# Patient Record
Sex: Female | Born: 1965 | Race: White | Hispanic: No | Marital: Married | Smoking: Never smoker
Health system: Southern US, Community
[De-identification: ages and names within clinical notes are randomized; demographics above are authoritative.]

## PROBLEM LIST (undated history)

## (undated) DIAGNOSIS — F99 Mental disorder, not otherwise specified: Secondary | ICD-10-CM

## (undated) DIAGNOSIS — X838XXA Intentional self-harm by other specified means, initial encounter: Secondary | ICD-10-CM

## (undated) DIAGNOSIS — R112 Nausea with vomiting, unspecified: Secondary | ICD-10-CM

## (undated) DIAGNOSIS — E079 Disorder of thyroid, unspecified: Secondary | ICD-10-CM

## (undated) DIAGNOSIS — I10 Essential (primary) hypertension: Secondary | ICD-10-CM

## (undated) DIAGNOSIS — R51 Headache: Secondary | ICD-10-CM

## (undated) DIAGNOSIS — K219 Gastro-esophageal reflux disease without esophagitis: Secondary | ICD-10-CM

## (undated) DIAGNOSIS — F419 Anxiety disorder, unspecified: Secondary | ICD-10-CM

## (undated) DIAGNOSIS — F329 Major depressive disorder, single episode, unspecified: Secondary | ICD-10-CM

## (undated) DIAGNOSIS — J302 Other seasonal allergic rhinitis: Secondary | ICD-10-CM

## (undated) DIAGNOSIS — Z9889 Other specified postprocedural states: Secondary | ICD-10-CM

## (undated) DIAGNOSIS — F32A Depression, unspecified: Secondary | ICD-10-CM

## (undated) HISTORY — PX: MOUTH SURGERY: SHX715

---

## 1997-08-10 ENCOUNTER — Inpatient Hospital Stay (HOSPITAL_COMMUNITY): Admission: AD | Admit: 1997-08-10 | Discharge: 1997-08-13 | Payer: Self-pay | Admitting: Obstetrics and Gynecology

## 1997-08-13 ENCOUNTER — Encounter: Admission: RE | Admit: 1997-08-13 | Discharge: 1997-11-11 | Payer: Self-pay | Admitting: Obstetrics and Gynecology

## 2001-05-23 ENCOUNTER — Inpatient Hospital Stay (HOSPITAL_COMMUNITY): Admission: AD | Admit: 2001-05-23 | Discharge: 2001-05-23 | Payer: Self-pay | Admitting: Obstetrics and Gynecology

## 2002-01-25 ENCOUNTER — Other Ambulatory Visit: Admission: RE | Admit: 2002-01-25 | Discharge: 2002-01-25 | Payer: Self-pay | Admitting: Obstetrics and Gynecology

## 2002-07-25 ENCOUNTER — Inpatient Hospital Stay (HOSPITAL_COMMUNITY): Admission: AD | Admit: 2002-07-25 | Discharge: 2002-07-25 | Payer: Self-pay | Admitting: Obstetrics and Gynecology

## 2002-07-25 ENCOUNTER — Encounter: Payer: Self-pay | Admitting: Obstetrics and Gynecology

## 2002-08-09 ENCOUNTER — Inpatient Hospital Stay (HOSPITAL_COMMUNITY): Admission: AD | Admit: 2002-08-09 | Discharge: 2002-08-12 | Payer: Self-pay | Admitting: Obstetrics and Gynecology

## 2002-09-21 ENCOUNTER — Other Ambulatory Visit: Admission: RE | Admit: 2002-09-21 | Discharge: 2002-09-21 | Payer: Self-pay | Admitting: Obstetrics and Gynecology

## 2003-03-18 ENCOUNTER — Emergency Department (HOSPITAL_COMMUNITY): Admission: AD | Admit: 2003-03-18 | Discharge: 2003-03-18 | Payer: Self-pay | Admitting: Family Medicine

## 2003-05-06 ENCOUNTER — Ambulatory Visit (HOSPITAL_COMMUNITY): Admission: RE | Admit: 2003-05-06 | Discharge: 2003-05-06 | Payer: Self-pay | Admitting: Endocrinology

## 2003-07-29 ENCOUNTER — Inpatient Hospital Stay (HOSPITAL_COMMUNITY): Admission: EM | Admit: 2003-07-29 | Discharge: 2003-07-31 | Payer: Self-pay | Admitting: Emergency Medicine

## 2003-08-02 ENCOUNTER — Other Ambulatory Visit (HOSPITAL_COMMUNITY): Admission: RE | Admit: 2003-08-02 | Discharge: 2003-08-06 | Payer: Self-pay | Admitting: Psychiatry

## 2003-10-23 ENCOUNTER — Other Ambulatory Visit: Admission: RE | Admit: 2003-10-23 | Discharge: 2003-10-23 | Payer: Self-pay | Admitting: Obstetrics and Gynecology

## 2004-12-01 ENCOUNTER — Other Ambulatory Visit: Admission: RE | Admit: 2004-12-01 | Discharge: 2004-12-01 | Payer: Self-pay | Admitting: Obstetrics and Gynecology

## 2005-10-15 ENCOUNTER — Encounter: Admission: RE | Admit: 2005-10-15 | Discharge: 2005-10-15 | Payer: Self-pay | Admitting: Neurology

## 2006-04-02 ENCOUNTER — Encounter: Admission: RE | Admit: 2006-04-02 | Discharge: 2006-04-02 | Payer: Self-pay | Admitting: Endocrinology

## 2006-04-15 ENCOUNTER — Encounter (INDEPENDENT_AMBULATORY_CARE_PROVIDER_SITE_OTHER): Payer: Self-pay | Admitting: *Deleted

## 2006-04-15 ENCOUNTER — Other Ambulatory Visit: Admission: RE | Admit: 2006-04-15 | Discharge: 2006-04-15 | Payer: Self-pay | Admitting: Interventional Radiology

## 2006-04-15 ENCOUNTER — Encounter: Admission: RE | Admit: 2006-04-15 | Discharge: 2006-04-15 | Payer: Self-pay | Admitting: Endocrinology

## 2006-10-01 ENCOUNTER — Encounter: Admission: RE | Admit: 2006-10-01 | Discharge: 2006-10-01 | Payer: Self-pay | Admitting: Allergy and Immunology

## 2006-11-03 ENCOUNTER — Encounter: Admission: RE | Admit: 2006-11-03 | Discharge: 2006-11-03 | Payer: Self-pay | Admitting: Endocrinology

## 2007-08-18 ENCOUNTER — Emergency Department (HOSPITAL_COMMUNITY): Admission: EM | Admit: 2007-08-18 | Discharge: 2007-08-18 | Payer: Self-pay | Admitting: Family Medicine

## 2007-08-18 ENCOUNTER — Emergency Department (HOSPITAL_COMMUNITY): Admission: EM | Admit: 2007-08-18 | Discharge: 2007-08-18 | Payer: Self-pay | Admitting: Emergency Medicine

## 2008-07-16 ENCOUNTER — Encounter: Admission: RE | Admit: 2008-07-16 | Discharge: 2008-07-16 | Payer: Self-pay | Admitting: Endocrinology

## 2009-01-03 ENCOUNTER — Encounter: Admission: RE | Admit: 2009-01-03 | Discharge: 2009-01-03 | Payer: Self-pay | Admitting: Endocrinology

## 2010-01-16 ENCOUNTER — Emergency Department (HOSPITAL_COMMUNITY): Admission: EM | Admit: 2010-01-16 | Discharge: 2010-01-16 | Payer: Self-pay | Admitting: Emergency Medicine

## 2010-03-12 ENCOUNTER — Encounter: Admission: RE | Admit: 2010-03-12 | Discharge: 2010-03-12 | Payer: Self-pay | Admitting: Endocrinology

## 2010-05-18 ENCOUNTER — Encounter: Payer: Self-pay | Admitting: Endocrinology

## 2010-05-19 ENCOUNTER — Encounter: Payer: Self-pay | Admitting: Endocrinology

## 2010-06-04 ENCOUNTER — Ambulatory Visit
Admission: RE | Admit: 2010-06-04 | Discharge: 2010-06-04 | Disposition: A | Discharge status: Home / Self Care | Payer: BC Managed Care – PPO | Source: Ambulatory Visit | Attending: Allergy and Immunology | Admitting: Allergy and Immunology

## 2010-06-04 ENCOUNTER — Other Ambulatory Visit: Payer: Self-pay | Admitting: Allergy and Immunology

## 2010-09-12 NOTE — H&P (Signed)
   Cynthia Hogan, Cynthia Hogan                       ACCOUNT NO.:  [id number]   MEDICAL RECORD NO.:  [id number]                   PATIENT TYPE:  INP   LOCATION:  NA                                   FACILITY:  [facility]   PHYSICIAN:  Dineen Kid. Rana Snare, M.D.                 DATE OF BIRTH:  Jan 22, 1966   DATE OF ADMISSION:  08/09/2002  DATE OF DISCHARGE:                                HISTORY & PHYSICAL   HISTORY OF PRESENT ILLNESS:  The patient is a 45 year old, G3, P1, at [redacted]  weeks gestational age who presents for cesarean section.  The patient has  history of difficult delivery of 8 pound 3 ounce infant in 1999, also  history of shoulder dystocia.  The patient at this time has a large-for-  gestational-age infant which at 37 weeks is measured at 8.2 pounds which is  96th percentile.  Because of history of difficult delivery, previous  shoulder dystocia, and also large for gestational age, she presents for  primary cesarean section.  Her pregnancy is complicated by advanced maternal  age.  She declined amniocentesis but had normal ultrasound including level 2  ultrasound.  The patient also has chronic hypertension and has been on  Aldomet and has had normal blood pressure on this medication throughout the  pregnancy.  She also has a history of group B strep, and her Glucola was  within normal limits.   PAST MEDICAL HISTORY:  Significant only for acid reflux and postpartum  depression after the last delivery and history of migraines.   PAST SURGICAL HISTORY:  Negative.   PAST OBSTETRICAL HISTORY:  She has had one vaginal delivery with shoulder  dystocia of 8 pound 3 ounce baby in 1999, and she had a miscarriage in  January 2003.   PHYSICAL EXAMINATION:  VITAL SIGNS:  Blood pressure 124/78.  HEART:  Regular rate and rhythm.  LUNGS:  Clear to auscultation bilaterally.  ABDOMEN:  Gravid, nontender.  PELVIC:  Cervix is closed, thick, and high.   IMPRESSION:  1. Intrauterine pregnancy at 39  weeks.  2. Previous difficult delivery with shoulder dystocia.  3. Large-for-gestational-age infant this pregnancy.   PLAN:  Primary low transverse cesarean section.  Risks and benefits were  discussed at length.  Informed consent was obtained.                                               Dineen Kid Rana Snare, M.D.    DCL/MEDQ  D:  08/08/2002  T:  08/08/2002  Job:  [id number]

## 2010-09-12 NOTE — Discharge Summary (Signed)
NAMEELLOWYN, RIEVES ANN                       ACCOUNT NO.:  [id number]   MEDICAL RECORD NO.:  [id number]                   PATIENT TYPE:  INP   LOCATION:  9105                                 [facility]ACILITY:  [facility]   PHYSICIAN:  Juluis Mire, M.D.                DATE OF BIRTH:  [birth year]   DATE OF ADMISSION:  08/09/2002  DATE OF DISCHARGE:  08/12/2002                                 DISCHARGE SUMMARY   ADMITTING DIAGNOSES:  1. Intrauterine pregnancy at 56 weeks estimated gestational age.  2. Previous shoulder dystocia and difficult delivery.  3. Large for gestational age infant.   DISCHARGE DIAGNOSES:  1. Status post low transverse cesarean section.  2. Viable female infant.   PROCEDURE:  Primary low transverse cesarean section.   REASON FOR ADMISSION:  Please see dictated H&P.   HOSPITAL COURSE:  The patient is a 45 year old gravida 3, para 1, that was  admitted to [facility] at 39 weeks estimated gestational  age.  Her previous delivery had been complicated by shoulder dystocia and a  difficult delivery.  Ultrasound with this pregnancy indicated a baby  weighing approximately 8 pounds and 2 ounces which was in the 96th  percentile.  Because of this, she presented to [facility]  for primary low transverse cesarean section.  On the morning of her  admission, the patient was taken to the operating room where spinal  anesthesia was administered without difficulty.  A low transverse incision  was made with the delivery of a viable female infant weighing 8 pounds and 15  ounces, Apgars of 7 at one minute and 9 at five minutes.  Umbilical cord pH  was 7.28.  The patient tolerated the procedure well and was taken to the  recovery room in stable condition.  On postoperative day 1, the patient had  good return of bowel function.  Abdomen was soft.  The fundus was firm and  nontender.  Abdominal dressing was clean, dry, and intact.  Labs show a  hemoglobin of  10.7, platelet count of 211,000, WBC count of 15.6.  On  postoperative day 2, the patient was doing well, vital signs were stable,  and she was afebrile.  Abdomen was soft and nontender.  Incision was noted  to be clean, dry, and intact.  The patient was ambulating well, tolerating a  regular diet, with no signs of nausea or vomiting.  On postoperative day,  the patient was doing well.  Vital signs were stable.  Abdomen was soft.  Fundus was firm.  Incision was clean, dry, and intact.  The staples were  removed and the patient was discharged home.   CONDITION ON DISCHARGE:  Good.   DIET:  Regular, as tolerated.   ACTIVITY:  No heavy lifting.  No driving x2 weeks.  No vaginal entry.   FOLLOWUP:  The patient is to follow up in the office in  one week for an  incision check.  She is to call for temperature greater than 103, persistent  nausea and vomiting, heavy vaginal bleeding, and redness or drainage of  incisional site.   DISCHARGE MEDICATIONS:  1. Tylox, #30, one p.o. every 4-6 hours p.r.n. pain.  2. Motrin 600 mg one every 6 hours p.r.n.  3. Prenatal vitamins one p.o. daily.  4. Colace one p.o. daily p.r.n.     Julio Sicks, N.P.                        Juluis Mire, M.D.    CC/MEDQ  D:  09/21/2002  T:  09/21/2002  Job:  [id number]

## 2010-09-12 NOTE — Op Note (Signed)
Cynthia Hogan, DIGUGLIELMO ANN                       ACCOUNT NO.:  [id number]   MEDICAL RECORD NO.:  [id number]                   PATIENT TYPE:  INP   LOCATION:  9198                                 [facility]ACILITY:  [facility]   PHYSICIAN:  Dineen Kid. Rana Snare, M.D.                 DATE OF BIRTH:  08-09-1965   DATE OF PROCEDURE:  08/09/2002  DATE OF DISCHARGE:                                 OPERATIVE REPORT   PREOPERATIVE DIAGNOSES:  1. Intrauterine pregnancy at 39 weeks.  2. Previous shoulder dystocia and difficult delivery.  3. Large for gestational age infant.   POSTOPERATIVE DIAGNOSES:  1. Intrauterine pregnancy at 39 weeks.  2. Previous shoulder dystocia and difficult de.  3. Large for gestational age infant.   PROCEDURE:  Primary low segment transverse cesarean section.   SURGEON:  Dineen Kid. Rana Snare, M.D.   ASSISTANTFreddy Finner, M.D.   ANESTHESIA:  Spinal.   ESTIMATED BLOOD LOSS:  800 mL.   INDICATIONS:  The patient is a 45 year old G3, P1 at 39 weeks.  Previous  delivery was complicated by shoulder dystocia and difficult delivery.  This  child is large for gestational age with last ultrasound showing 8.2 pound  baby with 96th percentile.  Because of the above she presents for primary  low segment transverse cesarean section.  Risks and benefits were discussed.  Informed consent was obtained.   FINDINGS:  Viable female infant.  Apgars were 7 and 9.  pH arterial was 7.28.  Weight is 8 pounds 15 ounces.   DESCRIPTION OF PROCEDURE:  After adequate analgesia the patient is placed in  the supine position, left lateral tilt.  She is sterilely prepped and  draped.  The bladder was sterilely drained.  The Pfannenstiel skin incision  was made two fingerbreadths above the pubic symphysis, taken down to the  fascia which was incised transversely, extended superiorly and inferiorly  off the bellies of the rectus muscle which were separated sharply in the  midline.  The peritoneum was entered  sharply.  Bladder blade was placed and  the uterine serosa was elevated, nicked, incised transversely and the  bladder flap was created.  A low segment myotomy incision was made down to  the amniotic sac.  Clear fluid was noted.  The incision was extended  laterally with the operator's fingertips.  The infant's vertex was delivered  into the incision.  Vacuum extractor was placed on the fetal vertex and was  delivered through the incision atraumatically.  Nares and pharynx were  suctioned.  Infant was then delivered.  Cord clamped, cut.  Infant was  handed to the pediatricians for resuscitation.  Cord blood was obtained.  The placenta extracted manually.  The uterus was exteriorized __________  with a dry lap.  The myotomy incision was closed in two layers, the first  being a running locking layer, the second being an imbricating layer of 0  Monocryl suture.  Irrigation was applied and hemostasis was achieved with  figure-of-eight of 0 Monocryl in the right incisional corner.  After  hemostasis was achieved the uterus was placed back in the peritoneal cavity.  After a copious amount of irrigation adequate hemostasis was assured.  The  peritoneum was closed with 0 Monocryl in a running fashion.  Rectus muscle  plicated in the midline.  Irrigation was applied and fascia was closed in  single layer with 0 PDS in a running fashion.  Irrigation once again applied  and after adequate hemostasis the skin was stapled, Steri-Strips applied.  The patient tolerated procedure well.  Was stable on transfer to recovery  room.  Sponge and instrument count was normal x3.  Estimated blood loss from  the procedure was 800 mL.                                               Dineen Kid Rana Snare, M.D.    DCL/MEDQ  D:  08/09/2002  T:  08/09/2002  Job:  [id number]
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
## 2011-03-16 ENCOUNTER — Other Ambulatory Visit: Payer: Self-pay | Admitting: Endocrinology

## 2011-03-16 DIAGNOSIS — E049 Nontoxic goiter, unspecified: Secondary | ICD-10-CM

## 2011-03-24 ENCOUNTER — Other Ambulatory Visit: Payer: BC Managed Care – PPO

## 2011-03-25 ENCOUNTER — Ambulatory Visit
Admission: RE | Admit: 2011-03-25 | Discharge: 2011-03-25 | Disposition: A | Discharge status: Home / Self Care | Payer: BC Managed Care – PPO | Source: Ambulatory Visit | Attending: Endocrinology | Admitting: Endocrinology

## 2011-03-25 DIAGNOSIS — E049 Nontoxic goiter, unspecified: Secondary | ICD-10-CM

## 2011-04-16 NOTE — H&P (Addendum)
  Cynthia Hogan is an 45 y.o. female.   Chief Complaint: Thyroid mass HPI: Right thyroid mass, enlarging over the years based on serial ultrasound exams.  No past medical history on file.  No past surgical history on file.  No family history on file. Social History:  does not have a smoking history on file. She does not have any smokeless tobacco history on file. Her alcohol and drug histories not on file.  Allergies: Allergies not on file  No current facility-administered medications on file as of .   No current outpatient prescriptions on file as of .    No results found for this or any previous visit (from the past 48 hour(s)). No results found.  ROS: otherwise negative  There were no vitals taken for this visit.  PHYSICAL EXAM: Overall appearance:  Healthy appearing, in no distress Head:  Normocephalic, atraumatic. Ears: External auditory canals are clear; tympanic membranes are intact and the middle ears are free of any effusion. Nose: External nose is healthy in appearance. Internal nasal exam free of any lesions or obstruction. Oral Cavity:  There are no mucosal lesions or masses identified. Oral Pharynx/Hypopharynx/Larynx: no signs of any mucosal lesions or masses identified. Vocal cords move normally. Neuro:  No identifiable neurologic deficits. Neck: Right thyroid lobe diffusely enlarged. No other masses.  Studies Reviewed: ultrasound reviewed.    Assessment/Plan Right thyroidectomt, with frozen section and possible total thyroidectomy.  Elijah Michaelis H 04/16/2011, 11:12 AM   The patient has been re-examined.  The patient's history and physical has been reviewed and is unchanged.  There is no change in the plan of care.   Serena Colonel, MD

## 2011-04-24 ENCOUNTER — Encounter (HOSPITAL_COMMUNITY): Payer: Self-pay

## 2011-05-04 ENCOUNTER — Other Ambulatory Visit: Payer: Self-pay

## 2011-05-04 ENCOUNTER — Encounter (HOSPITAL_COMMUNITY)
Admission: RE | Admit: 2011-05-04 | Discharge: 2011-05-04 | Disposition: A | Discharge status: Home / Self Care | Payer: BC Managed Care – PPO | Source: Ambulatory Visit | Attending: Anesthesiology | Admitting: Anesthesiology

## 2011-05-04 ENCOUNTER — Encounter (HOSPITAL_COMMUNITY)
Admission: RE | Admit: 2011-05-04 | Discharge: 2011-05-04 | Disposition: A | Discharge status: Home / Self Care | Payer: BC Managed Care – PPO | Source: Ambulatory Visit | Attending: Otolaryngology | Admitting: Otolaryngology

## 2011-05-04 ENCOUNTER — Encounter (HOSPITAL_COMMUNITY): Payer: Self-pay

## 2011-05-04 HISTORY — DX: Anxiety disorder, unspecified: F41.9

## 2011-05-04 HISTORY — DX: Disorder of thyroid, unspecified: E07.9

## 2011-05-04 HISTORY — DX: Gastro-esophageal reflux disease without esophagitis: K21.9

## 2011-05-04 HISTORY — DX: Major depressive disorder, single episode, unspecified: F32.9

## 2011-05-04 HISTORY — DX: Essential (primary) hypertension: I10

## 2011-05-04 HISTORY — DX: Mental disorder, not otherwise specified: F99

## 2011-05-04 HISTORY — DX: Depression, unspecified: F32.A

## 2011-05-04 HISTORY — DX: Other specified postprocedural states: Z98.890

## 2011-05-04 HISTORY — DX: Other specified postprocedural states: R11.2

## 2011-05-04 HISTORY — DX: Intentional self-harm by other specified means, initial encounter: X83.8XXA

## 2011-05-04 HISTORY — DX: Headache: R51

## 2011-05-04 HISTORY — DX: Other seasonal allergic rhinitis: J30.2

## 2011-05-04 LAB — BASIC METABOLIC PANEL
BUN: 13 mg/dL (ref 6–23)
CO2: 28 mEq/L (ref 19–32)
Calcium: 10.2 mg/dL (ref 8.4–10.5)
Chloride: 103 mEq/L (ref 96–112)
Creatinine, Ser: 0.72 mg/dL (ref 0.50–1.10)
GFR calc Af Amer: >90 mL/min (ref >90)
GFR calc non Af Amer: >90 mL/min (ref >90)
Glucose, Bld: 82 mg/dL (ref 70–99)
Potassium: 4.2 mEq/L (ref 3.5–5.1)
Sodium: 140 mEq/L (ref 135–145)

## 2011-05-04 LAB — SURGICAL PCR SCREEN
MRSA, PCR: NEGATIVE
Staphylococcus aureus: NEGATIVE

## 2011-05-04 LAB — CBC
HCT: 40.7 % (ref 36.0–46.0)
Hemoglobin: 13.2 g/dL (ref 12.0–15.0)
MCH: 28.8 pg (ref 26.0–34.0)
MCHC: 32.4 g/dL (ref 30.0–36.0)
MCV: 88.7 fL (ref 78.0–100.0)
Platelets: 332 10*3/uL (ref 150–400)
RBC: 4.59 MIL/uL (ref 3.87–5.11)
RDW: 13.5 % (ref 11.5–15.5)
WBC: 12.3 10*3/uL — ABNORMAL HIGH (ref 4.0–10.5)

## 2011-05-04 LAB — HCG, SERUM, QUALITATIVE: Preg, Serum: NEGATIVE

## 2011-05-04 NOTE — Progress Notes (Signed)
Chart to Shonna Chock PA to review for EKG.

## 2011-05-04 NOTE — Consult Note (Addendum)
Anesthesia:  Patient is a 46 year old female scheduled for a right thyroid lobectomy, possible thyroidectomy on 05/07/11.  History includes anxiety, depression, HTN, GERD, obesity, headaches, and attempted suicide.  Pre-operative labs acceptable.  CXR shows no active disease.  EKG shows NSR, new right BBB and LAD, cannot rule out anterior infarct (age undetermined).    I reviewed the EKG with Dr. Noreene Larsson.  Cardiology pre-operative evaluation recommended.  Dr. Lucky Rathke office is now closed, so I will notify his office tomorrow.  Addendum:  05/05/11 1610  Amy at Dr. Lucky Rathke office notified of need for preoperative Cardiology evaluation due to abnormal EKG.   Addendum: 05/06/11 1500  Dr. Patty Sermons saw Ms. Cynthia Hogan today and cleared her for this procedure without further testing.  See his office note under the Notes tab.

## 2011-05-04 NOTE — Pre-Procedure Instructions (Signed)
20 Kieanna Rollo  05/04/2011   Your procedure is scheduled on:  05/07/11  Report to [facility] at 5:30 AM.  Call this number if you have problems the morning of surgery: [phone]   Remember:   Do not eat food:After Midnight.  May have clear liquids: up to 4 Hours before arrival.  Clear liquids include soda, tea, black coffee, apple or grape juice, broth.  Take these medicines the morning of surgery with A SIP OF WATER: Zyrtec, Prozac, Lamictal, Synthroid, Vyvanse, Lithium, Zegerid, Zantac   Do not wear jewelry, make-up or nail polish.  Do not wear lotions, powders, or perfumes. You may wear deodorant.  Do not shave 48 hours prior to surgery.  Do not bring valuables to the hospital.  Contacts, dentures or bridgework may not be worn into surgery.  Leave suitcase in the car. After surgery it may be brought to your room.  For patients admitted to the hospital, checkout time is 11:00 AM the day of discharge.   Patients discharged the day of surgery will not be allowed to drive home.  Name and phone number of your driver: Patient is being admitted.  Special Instructions: CHG Shower Use Special Wash: 1/2 bottle night before surgery and 1/2 bottle morning of surgery.   Please read over the following fact sheets that you were given: Pain Booklet, Coughing and Deep Breathing, MRSA Information and Surgical Site Infection Prevention

## 2011-05-06 ENCOUNTER — Other Ambulatory Visit: Payer: Self-pay | Admitting: *Deleted

## 2011-05-06 ENCOUNTER — Ambulatory Visit (INDEPENDENT_AMBULATORY_CARE_PROVIDER_SITE_OTHER): Payer: BC Managed Care – PPO | Admitting: Cardiology

## 2011-05-06 VITALS — BP 108/74 | HR 80 | Ht 66.0 in | Wt 202.0 lb

## 2011-05-06 DIAGNOSIS — I4510 Unspecified right bundle-branch block: Secondary | ICD-10-CM

## 2011-05-06 MED ORDER — CEFAZOLIN SODIUM-DEXTROSE 2-3 GM-% IV SOLR
2.0000 g | INTRAVENOUS | Status: AC
  Administered 2011-05-07: 2 g via INTRAVENOUS
  Filled 2011-05-06: qty 50

## 2011-05-06 NOTE — Progress Notes (Signed)
Reason for Consult: Preoperative clearance Referring Physician: Dr. Langston Masker Cynthia Hogan is an 46 y.o. female.  HPI: This pleasant 46 year old woman is seen at the request of Dr. Noreene Larsson, anesthesiology, for evaluation of an abnormal EKG.  On her preoperative electrocardiogram done on 05/04/11 she was found to have a pattern of right bundle branch block.  She had had a previous EKG in 2005 which did not show a right bundle branch block.  She does not think that she has had any intervening EKGs.  She is scheduled for a thyroidectomy tomorrow.  The patient does not have any history of known heart disease.  She denies any chest pain or shortness of breath.  She denies any palpitations.  She has not had any history of dizzy spells or syncope.  Her energy level is good.  Her chest x-ray done 2 days ago shows normal heart size and clear lungs.  She has an annual physical with Dr. Clelia Croft each year and she is not diabetic and her cholesterol levels have been satisfactory.  She does not have any history of premature coronary disease in both of her parents are living and are in reasonably good health.  Past Medical History  Diagnosis Date  . PONV (postoperative nausea and vomiting)   . Seasonal allergies   . Thyroid mass   . Mental disorder     Bipolar  . Anxiety   . Depression   . Hypertension   . GERD (gastroesophageal reflux disease)   . Headache     has daily, migranes in past  . Suicide     attempt about 7 yrs ago    Past Surgical History  Procedure Date  . Cesarean section   . Mouth surgery     No family history on file.  Social History:  reports that she has never smoked. She has never used smokeless tobacco. She reports that she drinks alcohol. Her drug history not on file.  Allergies:  Allergies  Allergen Reactions  . Other     Anesthesia med makes pt very ill. Pt not sure which medication it was.    Medications: I have reviewed the patient's current medications.  No  results found for this or any previous visit (from the past 48 hour(s)).  No results found.  Review of systems: Negative except for present illness.  She has had a known goiter for several years and it has grown in size.  She also has a family history of thyroid cancer. Blood pressure 108/74, pulse 80, height 5\' 6"  (1.676 m), weight 202 lb (91.627 kg). The general appearance reveals a well-developed well-nourished woman in no distress.The head and neck exam reveals pupils equal and reactive.  Extraocular movements are full.  There is no scleral icterus.  The mouth and pharynx are normal.  The neck is supple.  The carotids reveal no bruits.  The jugular venous pressure is normal.  The  thyroid is enlarged on the right side and a pattern of a goiter.  There is no lymphadenopathy.  The chest is clear to percussion and auscultation.  There are no rales or rhonchi.  Expansion of the chest is symmetrical.  The precordium is quiet.  The first heart sound is normal.  The second heart sound is physiologically split.  There is no murmur gallop rub or click.  There is no abnormal lift or heave.  The abdomen is soft and nontender.  The bowel sounds are normal.  The liver and spleen are  not enlarged.  There are no abdominal masses.  There are no abdominal bruits.  Extremities reveal good pedal pulses.  There is no phlebitis or edema.  There is no cyanosis or clubbing.  Strength is normal and symmetrical in all extremities.  There is no lateralizing weakness.  There are no sensory deficits.  The skin is warm and dry.  There is no rash.  EKG done today shows normal sinus rhythm at 66 per minute and a right bundle branch block pattern.  There are no ischemic changes.  Assessment/Plan: 1.  Asymptomatic right bundle branch block of unknown duration, not present in 2005 2.   Bipolar illness 3.   Goiter 4.   GERD  Recommendation: The patient is  cleared for thyroid  surgery tomorrow. No further cardiac workup is  indicated at this time.  I did tell her that she should ask for a EKG each time she has her annual medical physical with Dr. Clelia Croft to confirm as to whether the right bundle branch block is fixed or intermittent.  If the patient begins to have any cardiac symptoms such as palpitations or chest pain or shortness of breath, further cardiology workup could be considered such as an echocardiogram or possibly a stress test.  No further studies are needed at this time however. Thank you for the opportunity to see this pleasant woman with you.  Cassell Clement 05/06/2011, 12:59 PM

## 2011-05-06 NOTE — Progress Notes (Signed)
Addended by: Regis Bill B on: 05/06/2011 03:13 PM   Modules accepted: Orders

## 2011-05-06 NOTE — Patient Instructions (Signed)
Your physician recommends that you continue on your current medications as directed. Please refer to the Current Medication list given to you today. Return on an as needed basis

## 2011-05-06 NOTE — Progress Notes (Signed)
Pt. Has been cleared by Dr. Patty Sermons for surgery per Jasmine December in Dr. Lucky Rathke office. Pt. Will bring clearance letter with her in the AM.

## 2011-05-06 NOTE — Progress Notes (Signed)
Dr. Lucky Rathke nurse states that pt. Is scheduled to see Dr. Patty Sermons today @ 11:15 am for cardiac clearance.  The office will fax [place] any information from Dr. Patty Sermons to [phone].

## 2011-05-07 ENCOUNTER — Encounter (HOSPITAL_COMMUNITY): Payer: Self-pay | Admitting: Vascular Surgery

## 2011-05-07 ENCOUNTER — Ambulatory Visit (HOSPITAL_COMMUNITY)
Admission: RE | Admit: 2011-05-07 | Discharge: 2011-05-08 | Disposition: A | Discharge status: Home / Self Care | Payer: BC Managed Care – PPO | Source: Ambulatory Visit | Attending: Otolaryngology | Admitting: Otolaryngology

## 2011-05-07 ENCOUNTER — Encounter (HOSPITAL_COMMUNITY)
Admission: RE | Disposition: A | Discharge status: Home / Self Care | Payer: Self-pay | Source: Ambulatory Visit | Attending: Otolaryngology

## 2011-05-07 ENCOUNTER — Ambulatory Visit (HOSPITAL_COMMUNITY): Payer: BC Managed Care – PPO | Admitting: Vascular Surgery

## 2011-05-07 ENCOUNTER — Encounter (HOSPITAL_COMMUNITY): Payer: Self-pay | Admitting: *Deleted

## 2011-05-07 ENCOUNTER — Other Ambulatory Visit: Payer: Self-pay | Admitting: Otolaryngology

## 2011-05-07 DIAGNOSIS — Z01812 Encounter for preprocedural laboratory examination: Secondary | ICD-10-CM | POA: Insufficient documentation

## 2011-05-07 DIAGNOSIS — I10 Essential (primary) hypertension: Secondary | ICD-10-CM | POA: Insufficient documentation

## 2011-05-07 DIAGNOSIS — Z01810 Encounter for preprocedural cardiovascular examination: Secondary | ICD-10-CM | POA: Insufficient documentation

## 2011-05-07 DIAGNOSIS — Z01818 Encounter for other preprocedural examination: Secondary | ICD-10-CM | POA: Insufficient documentation

## 2011-05-07 DIAGNOSIS — K219 Gastro-esophageal reflux disease without esophagitis: Secondary | ICD-10-CM | POA: Insufficient documentation

## 2011-05-07 DIAGNOSIS — R51 Headache: Secondary | ICD-10-CM | POA: Insufficient documentation

## 2011-05-07 DIAGNOSIS — E049 Nontoxic goiter, unspecified: Secondary | ICD-10-CM | POA: Insufficient documentation

## 2011-05-07 DIAGNOSIS — E079 Disorder of thyroid, unspecified: Secondary | ICD-10-CM

## 2011-05-07 HISTORY — PX: THYROIDECTOMY: SHX17

## 2011-05-07 SURGERY — THYROIDECTOMY
Anesthesia: General | Site: Neck | Laterality: Right | Wound class: Clean

## 2011-05-07 MED ORDER — DEXTROSE-NACL 5-0.9 % IV SOLN
INTRAVENOUS | Status: DC
  Administered 2011-05-07 – 2011-05-08 (×2): via INTRAVENOUS

## 2011-05-07 MED ORDER — ANIMAL SHAPES WITH C & FA PO CHEW
2.0000 | CHEWABLE_TABLET | Freq: Every day | ORAL | Status: DC
  Filled 2011-05-07 (×2): qty 2

## 2011-05-07 MED ORDER — 0.9 % SODIUM CHLORIDE (POUR BTL) OPTIME
TOPICAL | Status: DC | PRN
  Administered 2011-05-07: 1000 mL

## 2011-05-07 MED ORDER — PROPOFOL 10 MG/ML IV EMUL
INTRAVENOUS | Status: DC | PRN
  Administered 2011-05-07: 200 mg via INTRAVENOUS

## 2011-05-07 MED ORDER — ROCURONIUM BROMIDE 100 MG/10ML IV SOLN
INTRAVENOUS | Status: DC | PRN
  Administered 2011-05-07: 40 mg via INTRAVENOUS

## 2011-05-07 MED ORDER — FAMOTIDINE 10 MG PO TABS
10.0000 mg | ORAL_TABLET | Freq: Two times a day (BID) | ORAL | Status: DC
  Administered 2011-05-08: 10 mg via ORAL
  Filled 2011-05-07 (×3): qty 1

## 2011-05-07 MED ORDER — FLUOXETINE HCL 20 MG PO CAPS
20.0000 mg | ORAL_CAPSULE | Freq: Every day | ORAL | Status: DC
  Administered 2011-05-07 – 2011-05-08 (×2): 20 mg via ORAL
  Filled 2011-05-07 (×3): qty 1

## 2011-05-07 MED ORDER — PHENOL 1.4 % MT LIQD
1.0000 | OROMUCOSAL | Status: DC | PRN
  Administered 2011-05-07: 1 via OROMUCOSAL
  Filled 2011-05-07: qty 177

## 2011-05-07 MED ORDER — LIDOCAINE-EPINEPHRINE 1 %-1:100000 IJ SOLN
INTRAMUSCULAR | Status: DC | PRN
  Administered 2011-05-07: 2.5 mL via INTRADERMAL

## 2011-05-07 MED ORDER — LACTATED RINGERS IV SOLN
INTRAVENOUS | Status: DC | PRN
  Administered 2011-05-07 (×2): via INTRAVENOUS

## 2011-05-07 MED ORDER — ONDANSETRON HCL 4 MG/2ML IJ SOLN
INTRAMUSCULAR | Status: DC | PRN
  Administered 2011-05-07: 4 mg via INTRAVENOUS

## 2011-05-07 MED ORDER — IBUPROFEN 100 MG/5ML PO SUSP
400.0000 mg | Freq: Four times a day (QID) | ORAL | Status: DC | PRN
  Filled 2011-05-07: qty 20

## 2011-05-07 MED ORDER — LISDEXAMFETAMINE DIMESYLATE 70 MG PO CAPS
70.0000 mg | ORAL_CAPSULE | ORAL | Status: DC
  Administered 2011-05-08: 70 mg via ORAL
  Filled 2011-05-07 (×2): qty 1

## 2011-05-07 MED ORDER — LAMOTRIGINE 150 MG PO TABS
300.0000 mg | ORAL_TABLET | Freq: Every day | ORAL | Status: DC
Start: 2011-05-07 — End: 2011-05-08
  Administered 2011-05-07: 300 mg via ORAL
  Filled 2011-05-07 (×2): qty 2

## 2011-05-07 MED ORDER — ACETAMINOPHEN 650 MG RE SUPP: 650.0000 mg | RECTAL | Status: DC | PRN

## 2011-05-07 MED ORDER — GLYCOPYRROLATE 0.2 MG/ML IJ SOLN
INTRAMUSCULAR | Status: DC | PRN
  Administered 2011-05-07: .4 mg via INTRAVENOUS

## 2011-05-07 MED ORDER — HYDROMORPHONE HCL PF 1 MG/ML IJ SOLN
INTRAMUSCULAR | Status: AC
Start: 2011-05-07 — End: 2011-05-07
  Filled 2011-05-07: qty 1

## 2011-05-07 MED ORDER — LISINOPRIL 20 MG PO TABS
20.0000 mg | ORAL_TABLET | Freq: Every day | ORAL | Status: DC
  Administered 2011-05-08: 20 mg via ORAL
  Filled 2011-05-07 (×3): qty 1

## 2011-05-07 MED ORDER — DEXAMETHASONE SODIUM PHOSPHATE 4 MG/ML IJ SOLN
INTRAMUSCULAR | Status: DC | PRN
  Administered 2011-05-07: 8 mg via INTRAVENOUS

## 2011-05-07 MED ORDER — NEOSTIGMINE METHYLSULFATE 1 MG/ML IJ SOLN
INTRAMUSCULAR | Status: DC | PRN
  Administered 2011-05-07: 3 mg via INTRAVENOUS

## 2011-05-07 MED ORDER — HYDROMORPHONE HCL PF 1 MG/ML IJ SOLN
0.2500 mg | INTRAMUSCULAR | Status: DC | PRN
  Administered 2011-05-07 (×4): 0.5 mg via INTRAVENOUS

## 2011-05-07 MED ORDER — ONDANSETRON HCL 4 MG/2ML IJ SOLN: 4.0000 mg | Freq: Once | INTRAMUSCULAR | Status: DC | PRN

## 2011-05-07 MED ORDER — LITHIUM CARBONATE ER 450 MG PO TBCR
450.0000 mg | EXTENDED_RELEASE_TABLET | Freq: Two times a day (BID) | ORAL | Status: DC
  Administered 2011-05-07 – 2011-05-08 (×2): 450 mg via ORAL
  Filled 2011-05-07 (×4): qty 1

## 2011-05-07 MED ORDER — PROMETHAZINE HCL 25 MG RE SUPP: 25.0000 mg | Freq: Four times a day (QID) | RECTAL | Status: DC | PRN

## 2011-05-07 MED ORDER — LIDOCAINE HCL (CARDIAC) 20 MG/ML IV SOLN
INTRAVENOUS | Status: DC | PRN
  Administered 2011-05-07: 60 mg via INTRAVENOUS

## 2011-05-07 MED ORDER — EPHEDRINE SULFATE 50 MG/ML IJ SOLN
INTRAMUSCULAR | Status: DC | PRN
  Administered 2011-05-07 (×5): 10 mg via INTRAVENOUS

## 2011-05-07 MED ORDER — SCOPOLAMINE 1 MG/3DAYS TD PT72
MEDICATED_PATCH | TRANSDERMAL | Status: DC | PRN
  Administered 2011-05-07: 1.5 mg via TRANSDERMAL

## 2011-05-07 MED ORDER — PANTOPRAZOLE SODIUM 40 MG PO PACK
40.0000 mg | PACK | Freq: Every day | ORAL | Status: DC
  Filled 2011-05-07 (×2): qty 20

## 2011-05-07 MED ORDER — LORATADINE 10 MG PO TABS
10.0000 mg | ORAL_TABLET | Freq: Every day | ORAL | Status: DC
  Administered 2011-05-08: 10 mg via ORAL
  Filled 2011-05-07 (×3): qty 1

## 2011-05-07 MED ORDER — THERA M PLUS PO TABS: 2.0000 | ORAL_TABLET | Freq: Every day | ORAL | Status: DC

## 2011-05-07 MED ORDER — HYDROCODONE-ACETAMINOPHEN 5-325 MG PO TABS
1.0000 | ORAL_TABLET | ORAL | Status: DC | PRN
  Administered 2011-05-07: 2 via ORAL
  Administered 2011-05-07 (×2): 1 via ORAL
  Administered 2011-05-08 (×2): 2 via ORAL
  Filled 2011-05-07 (×4): qty 2

## 2011-05-07 MED ORDER — OMEPRAZOLE-SODIUM BICARBONATE 20-1100 MG PO CAPS: 1.0000 | ORAL_CAPSULE | Freq: Every day | ORAL | Status: DC

## 2011-05-07 MED ORDER — ACETAMINOPHEN 160 MG/5ML PO SOLN: 650.0000 mg | ORAL | Status: DC | PRN

## 2011-05-07 MED ORDER — LEVOTHYROXINE SODIUM 25 MCG PO TABS
25.0000 ug | ORAL_TABLET | Freq: Every day | ORAL | Status: DC
  Administered 2011-05-08: 25 ug via ORAL
  Filled 2011-05-07 (×3): qty 1

## 2011-05-07 MED ORDER — PROMETHAZINE HCL 25 MG PO TABS: 25.0000 mg | ORAL_TABLET | Freq: Four times a day (QID) | ORAL | Status: DC | PRN

## 2011-05-07 MED ORDER — DEXAMETHASONE SODIUM PHOSPHATE 4 MG/ML IJ SOLN: INTRAMUSCULAR | Status: DC | PRN

## 2011-05-07 MED ORDER — FENTANYL CITRATE 0.05 MG/ML IJ SOLN
INTRAMUSCULAR | Status: DC | PRN
  Administered 2011-05-07: 100 ug via INTRAVENOUS
  Administered 2011-05-07: 25 ug via INTRAVENOUS

## 2011-05-07 SURGICAL SUPPLY — 53 items
ADH SKN CLS APL DERMABOND .7 (GAUZE/BANDAGES/DRESSINGS) ×2
APPLIER CLIP 9.375 SM OPEN (CLIP)
APR CLP SM 9.3 20 MLT OPN (CLIP)
ATTRACTOMAT 16X20 MAGNETIC DRP (DRAPES) IMPLANT
CANISTER SUCTION 2500CC (MISCELLANEOUS) ×2 IMPLANT
CLEANER TIP ELECTROSURG 2X2 (MISCELLANEOUS) ×2 IMPLANT
CLIP APPLIE 9.375 SM OPEN (CLIP) IMPLANT
CLOTH BEACON ORANGE TIMEOUT ST (SAFETY) ×2 IMPLANT
CONT SPEC 4OZ CLIKSEAL STRL BL (MISCELLANEOUS) ×2 IMPLANT
CORDS BIPOLAR (ELECTRODE) ×2 IMPLANT
COVER SURGICAL LIGHT HANDLE (MISCELLANEOUS) ×2 IMPLANT
DECANTER SPIKE VIAL GLASS SM (MISCELLANEOUS) ×1 IMPLANT
DERMABOND ADVANCED (GAUZE/BANDAGES/DRESSINGS) ×2
DERMABOND ADVANCED .7 DNX12 (GAUZE/BANDAGES/DRESSINGS) IMPLANT
DRAIN JACKSON RD 7FR 3/32 (WOUND CARE) IMPLANT
DRAIN SNY 10 ROU (WOUND CARE) ×1 IMPLANT
DRAIN SNY 10X20 3/4 PERF (WOUND CARE) IMPLANT
ELECT COATED BLADE 2.86 ST (ELECTRODE) ×2 IMPLANT
ELECT REM PT RETURN 9FT ADLT (ELECTROSURGICAL) ×2
ELECTRODE REM PT RTRN 9FT ADLT (ELECTROSURGICAL) ×1 IMPLANT
EVACUATOR SILICONE 100CC (DRAIN) ×2 IMPLANT
GAUZE SPONGE 4X4 16PLY XRAY LF (GAUZE/BANDAGES/DRESSINGS) ×2 IMPLANT
GLOVE BIO SURGEON STRL SZ 6.5 (GLOVE) ×1 IMPLANT
GLOVE BIO SURGEON STRL SZ7 (GLOVE) ×1 IMPLANT
GLOVE BIOGEL PI IND STRL 7.0 (GLOVE) IMPLANT
GLOVE BIOGEL PI IND STRL 7.5 (GLOVE) IMPLANT
GLOVE BIOGEL PI INDICATOR 7.0 (GLOVE) ×2
GLOVE BIOGEL PI INDICATOR 7.5 (GLOVE) ×1
GLOVE ECLIPSE 7.5 STRL STRAW (GLOVE) ×3 IMPLANT
GLOVE SURG SS PI 6.5 STRL IVOR (GLOVE) ×1 IMPLANT
GLOVE SURG SS PI 7.0 STRL IVOR (GLOVE) ×1 IMPLANT
GOWN STRL NON-REIN LRG LVL3 (GOWN DISPOSABLE) ×7 IMPLANT
KIT BASIN OR (CUSTOM PROCEDURE TRAY) ×2 IMPLANT
KIT ROOM TURNOVER OR (KITS) ×2 IMPLANT
NDL HYPO 25GX1X1/2 BEV (NEEDLE) IMPLANT
NEEDLE HYPO 25GX1X1/2 BEV (NEEDLE) ×2 IMPLANT
NS IRRIG 1000ML POUR BTL (IV SOLUTION) ×2 IMPLANT
PAD ARMBOARD 7.5X6 YLW CONV (MISCELLANEOUS) ×4 IMPLANT
PENCIL FOOT CONTROL (ELECTRODE) ×2 IMPLANT
SPECIMEN JAR MEDIUM (MISCELLANEOUS) ×1 IMPLANT
SPONGE INTESTINAL PEANUT (DISPOSABLE) IMPLANT
STAPLER VISISTAT 35W (STAPLE) ×2 IMPLANT
SUT CHROMIC 3 0 SH 27 (SUTURE) IMPLANT
SUT CHROMIC 4 0 PS 2 18 (SUTURE) ×4 IMPLANT
SUT ETHILON 3 0 PS 1 (SUTURE) IMPLANT
SUT ETHILON 5 0 P 3 18 (SUTURE) ×1
SUT NYLON ETHILON 5-0 P-3 1X18 (SUTURE) ×1 IMPLANT
SUT SILK 3 0 REEL (SUTURE) IMPLANT
SUT SILK 4 0 REEL (SUTURE) ×2 IMPLANT
TOWEL OR 17X24 6PK STRL BLUE (TOWEL DISPOSABLE) ×2 IMPLANT
TOWEL OR 17X26 10 PK STRL BLUE (TOWEL DISPOSABLE) ×2 IMPLANT
TRAY ENT MC OR (CUSTOM PROCEDURE TRAY) ×2 IMPLANT
WATER STERILE IRR 1000ML POUR (IV SOLUTION) ×1 IMPLANT

## 2011-05-07 NOTE — Progress Notes (Signed)
Dr. Ivin Booty notified that patient has 2 toe rings on left foot.

## 2011-05-07 NOTE — Op Note (Signed)
OPERATIVE REPORT  DATE OF SURGERY: 05/07/2011  PATIENT:  [name],  46 y.o. female  PRE-OPERATIVE DIAGNOSIS:  right thyroid mass  POST-OPERATIVE DIAGNOSIS:  right thyroid mass  PROCEDURE:  Procedure(s): THYROIDECTOMY  SURGEON:  Susy Frizzle, MD  ASSISTANTS: Aquilla Hacker, PA  ANESTHESIA:   general  EBL:  15 ml  DRAINS: (10 french) Jackson-Pratt drain(s) with closed bulb suction in the neck   LOCAL MEDICATIONS USED:  XYLOCAINE 3CC  SPECIMEN:  Source of Specimen:  Right thyroid lobe  COUNTS:  YES  PROCEDURE DETAILS: Patient was taken to the operating room and placed on the operating table in the supine position. Following induction of general endotracheal anesthesia the neck was prepped and draped in standard fashion. A low collar incision was outlined with a marking pen and injected with Xylocaine/epinephrine solution. A #15 scalpel was used to incise the skin and subcutaneous tissue. Electrocautery was used to dissect through the superficial fascia. The platysma layer was divided as well and subplatysmal flaps were elevated superiorly to the thyroid cartilage and inferiorly to the clavicle. The self-retaining thyroid retractor was used throughout the case. The midline fascia was divided and the strap muscles were reflected laterally on the right side exposing the thyroid lobe. The thyroid lobe was reflected medially. The superior vasculature was dissected initially, 4-0 silk ties were used for ligatures. As the superior lobe was brought down inferiorly the middle thyroid vein was identified and ligated between clamps and divided. What appeared to be a superior and inferior parathyroid gland were identified and preserved with their blood supplies. The recurrent laryngeal nerve was identified and preserved. The inferior vasculature was separately ligated and divided in a similar fashion. As the gland was brought forward the ligament of Allyson Sabal was divided. The isthmus was  divided to the left of midline to include a large nodule at the mid portion of the isthmus. The specimen was delivered and sent for frozen section evaluation which was consistent with benign multinodular goiter. A separate right pretracheal mass was then dissected out and was found to have a cystic component. This was sent separately for biopsy. The wound was irrigated with saline. Hemostasis was completed using ties and bipolar cautery as needed. A #10 [place] round drain was left in the wound exiting through the left side of the incision and secured in place with a nylon suture. The midline fascia was reapproximated with chromic suture. The platysma layer was reapproximated in a similar fashion as was the subcuticular plane. Dermabond was used on the skin. The patient was awakened, extubated and transferred to recovery in stable condition.   PATIENT DISPOSITION:  PACU - hemodynamically stable.

## 2011-05-07 NOTE — Op Note (Signed)
Subjective: Doing well, a little discomfort, breathing and swallowing well. Voice in and out.  Objective: Vital signs in last 24 hours: Temp:  [97.9 F (36.6 C)-98.6 F (37 C)] 98.6 F (37 C) (01/10 1453) Pulse Rate:  [58-95] 95  (01/10 1453) Resp:  [14-40] 19  (01/10 1453) BP: (93-122)/(56-74) 93/56 mmHg (01/10 1453) SpO2:  [95 %-100 %] 95 % (01/10 1453) Weight change:  Last BM Date: 05/06/11  Intake/Output from previous day:   Intake/Output this shift: Total I/O In: 1500 [I.V.:1500] Out: 115 [Urine:100; Drains:15]  PHYSICAL EXAM: Incision looks excellent, no swelling. Voice strong but gravelly. JP functioning.   Lab Results: No results found for this basename: WBC:2,HGB:2,HCT:2,PLT:2 in the last 72 hours BMET No results found for this basename: NA:2,K:2,CL:2,CO2:2,GLUCOSE:2,BUN:2,CREATININE:2,CALCIUM:2 in the last 72 hours  Studies/Results: No results found.  Medications: I have reviewed the patient's current medications.  Assessment/Plan: Stable post op. Overnight observation.  LOS: 0 days   Jeremiah Curci H 05/07/2011, 5:50 PM

## 2011-05-07 NOTE — Anesthesia Postprocedure Evaluation (Signed)
  Anesthesia Post-op Note  Patient: Cynthia Hogan  Procedure(s) Performed:  THYROIDECTOMY - RIGHT THYROID LOBECTOMY WITH FROZEN SECTION   Patient Location: PACU  Anesthesia Type: General  Level of Consciousness: awake, alert , oriented and patient cooperative  Airway and Oxygen Therapy: Patient Spontanous Breathing and Patient connected to nasal cannula oxygen  Post-op Pain: mild  Post-op Assessment: Post-op Vital signs reviewed, Patient's Cardiovascular Status Stable, Respiratory Function Stable, Patent Airway, No signs of Nausea or vomiting and Pain level controlled  Post-op Vital Signs: stable  Complications: No apparent anesthesia complications

## 2011-05-07 NOTE — Anesthesia Procedure Notes (Signed)
Procedure Name: Intubation Date/Time: 05/07/2011 7:45 AM Performed by: Ellin Goodie Pre-anesthesia Checklist: Patient identified, Emergency Drugs available, Suction available, Patient being monitored and Timeout performed Patient Re-evaluated:Patient Re-evaluated prior to inductionOxygen Delivery Method: Circle System Utilized Preoxygenation: Pre-oxygenation with 100% oxygen Intubation Type: IV induction Ventilation: Mask ventilation without difficulty Laryngoscope Size: Miller and 2 Grade View: Grade I Tube type: Oral Tube size: 7.5 mm Number of attempts: 1 Airway Equipment and Method: stylet Placement Confirmation: ETT inserted through vocal cords under direct vision,  positive ETCO2,  CO2 detector and breath sounds checked- equal and bilateral Secured at: 21 cm Tube secured with: Tape Dental Injury: Teeth and Oropharynx as per pre-operative assessment  Comments: LTA utilized

## 2011-05-07 NOTE — Transfer of Care (Signed)
Immediate Anesthesia Transfer of Care Note  Patient: Cynthia Hogan  Procedure(s) Performed:  THYROIDECTOMY - RIGHT THYROID LOBECTOMY WITH FROZEN SECTION   Patient Location: PACU  Anesthesia Type: General  Level of Consciousness: awake  Airway & Oxygen Therapy: Patient Spontanous Breathing  Post-op Assessment: Report given to PACU RN  Post vital signs: stable  Complications: No apparent anesthesia complications

## 2011-05-07 NOTE — Anesthesia Preprocedure Evaluation (Addendum)
Anesthesia Evaluation  Patient identified by MRN, date of birth, ID band Patient awake    Reviewed: Allergy & Precautions, H&P , Patient's Chart, lab work & pertinent test results  History of Anesthesia Complications (+) PONV  Airway Mallampati: I TM Distance: >3 FB Neck ROM: full    Dental  (+) Teeth Intact   Pulmonary neg pulmonary ROS,    Pulmonary exam normal       Cardiovascular hypertension, regular Normal    Neuro/Psych  Headaches,    GI/Hepatic Neg liver ROS, GERD-  ,  Endo/Other  Negative Endocrine ROS  Renal/GU negative Renal ROS  Genitourinary negative   Musculoskeletal   Abdominal   Peds  Hematology   Anesthesia Other Findings   Reproductive/Obstetrics                          Anesthesia Physical Anesthesia Plan  ASA: II  Anesthesia Plan: General ETT   Post-op Pain Management:    Induction: Intravenous  Airway Management Planned: Oral ETT  Additional Equipment:   Intra-op Plan:   Post-operative Plan: Extubation in OR  Informed Consent: I have reviewed the patients History and Physical, chart, labs and discussed the procedure including the risks, benefits and alternatives for the proposed anesthesia with the patient or authorized representative who has indicated his/her understanding and acceptance.     Plan Discussed with: Anesthesiologist, CRNA and Surgeon  Anesthesia Plan Comments:         Anesthesia Quick Evaluation

## 2011-05-07 NOTE — Progress Notes (Signed)
Dr. Pollyann Kennedy at bedside, talked to pt.

## 2011-05-08 ENCOUNTER — Encounter (HOSPITAL_COMMUNITY): Payer: Self-pay | Admitting: Otolaryngology

## 2011-05-08 MED ORDER — PROMETHAZINE HCL 25 MG RE SUPP: 25.0000 mg | Freq: Four times a day (QID) | RECTAL | Status: DC | PRN

## 2011-05-08 MED ORDER — HYDROCODONE-ACETAMINOPHEN 5-325 MG PO TABS: 1.0000 | ORAL_TABLET | ORAL | Status: DC | PRN

## 2011-05-08 NOTE — Progress Notes (Signed)
Patient discharged to home in care of husband. Medications and instructions reviewed with patient and spouse with no questions. IV d/c'd with cath intact. Assessment unchanged from this am. Patient is to follow up with Dr. Pollyann Kennedy in 1 week.

## 2011-05-08 NOTE — Progress Notes (Signed)
Patient ID: Cynthia Hogan, female   DOB: 1965/10/01, 46 y.o.   MRN: [id number] Subjective: No complaints.  Objective: Vital signs in last 24 hours: Temp:  [98 F (36.7 C)-98.6 F (37 C)] 98.6 F (37 C) (01/11 1009) Pulse Rate:  [77-95] 85  (01/11 1009) Resp:  [17-20] 17  (01/11 1009) BP: (85-121)/(56-71) 104/71 mmHg (01/11 1009) SpO2:  [95 %-98 %] 98 % (01/11 1009) Weight:  [91.627 kg (202 lb)] 91.627 kg (202 lb) (01/11 0100) Weight change:  Last BM Date: 05/05/11  Intake/Output from previous day: 01/10 0701 - 01/11 0700 In: 3361 [P.O.:360; I.V.:3000] Out: 2115 [Urine:2100; Drains:15] Intake/Output this shift: Total I/O In: 360 [P.O.:360] Out: -   PHYSICAL EXAM: Neck and voice excellent. JP removed.  Lab Results: No results found for this basename: WBC:2,HGB:2,HCT:2,PLT:2 in the last 72 hours BMET No results found for this basename: NA:2,K:2,CL:2,CO2:2,GLUCOSE:2,BUN:2,CREATININE:2,CALCIUM:2 in the last 72 hours  Studies/Results: No results found.  Medications: I have reviewed the patient's current medications.  Assessment/Plan: Doing well, discharge home.  LOS: 1 day   Christop Hippert H 05/08/2011, 12:29 PM

## 2011-05-08 NOTE — Discharge Summary (Signed)
  Physician Discharge Summary  Patient ID: Cynthia Hogan MRN: [id number] DOB/AGE: 1966/01/21 45 y.o.  Admit date: 05/07/2011 Discharge date: 05/08/2011  Admission Diagnoses:thyroid mass  Discharge Diagnoses:  Active Problems:  * No active hospital problems. *    Discharged Condition: good  Hospital Course: no complications  Consults: none  Significant Diagnostic Studies: none  Treatments: surgery: thyroid lobectomy  Discharge Exam: Blood pressure 104/71, pulse 85, temperature 98.6 F (37 C), temperature source Oral, resp. rate 17, height 5\' 6"  (1.676 m), weight 91.627 kg (202 lb), SpO2 98.00%. PHYSICAL EXAM: Neck and voice excellent.  Disposition:   Discharge Orders    Future Orders Please Complete By Expires   Diet - low sodium heart healthy      Increase activity slowly        Current Discharge Medication List    START taking these medications   Details  HYDROcodone-acetaminophen (NORCO) 5-325 MG per tablet Take 1-2 tablets by mouth every 4 (four) hours as needed. Qty: 30 tablet, Refills: 0    promethazine (PHENERGAN) 25 MG suppository Place 1 suppository (25 mg total) rectally every 6 (six) hours as needed for nausea. Qty: 12 each, Refills: 0      CONTINUE these medications which have NOT CHANGED   Details  cetirizine (ZYRTEC) 10 MG tablet Take 10 mg by mouth daily.      FLUoxetine (PROZAC) 20 MG capsule Take 20 mg by mouth daily.      lamoTRIgine (LAMICTAL) 100 MG tablet Take 300 mg by mouth at bedtime.      levothyroxine (SYNTHROID, LEVOTHROID) 25 MCG tablet Take 25 mcg by mouth daily.      lisdexamfetamine (VYVANSE) 70 MG capsule Take 70 mg by mouth every morning.      lisinopril (PRINIVIL,ZESTRIL) 20 MG tablet Take 20 mg by mouth daily.      LITHIUM CARBONATE PO Take 450-675 tablets by mouth 2 (two) times daily. Takes 1 tablet in the morning and 1.5 tablets at night.     Multiple Vitamins-Minerals (MULTIVITAMINS THER. W/MINERALS) TABS Take 2  tablets by mouth daily. Chewable.     Naphazoline-Pheniramine (OPCON-A OP) Apply 1 drop to eye 2 (two) times daily as needed. For red eyes/irritation.     Omeprazole-Sodium Bicarbonate (ZEGERID OTC PO) Take 1 tablet by mouth at bedtime.      ranitidine (ZANTAC) 150 MG tablet Take 150 mg by mouth every morning.         Follow-up Information    Follow up with Maycen Degregory H, MD. Call in 1 week.   Contact information:   [address] [place] [address] [phone]          Signed: Susy Frizzle 05/08/2011, 12:33 PM

## 2011-05-09 ENCOUNTER — Other Ambulatory Visit: Payer: Self-pay

## 2011-05-09 ENCOUNTER — Emergency Department (HOSPITAL_COMMUNITY)
Admission: EM | Admit: 2011-05-09 | Discharge: 2011-05-09 | Disposition: A | Discharge status: Home / Self Care | Payer: BC Managed Care – PPO | Attending: Emergency Medicine | Admitting: Emergency Medicine

## 2011-05-09 ENCOUNTER — Emergency Department (HOSPITAL_COMMUNITY): Payer: BC Managed Care – PPO

## 2011-05-09 ENCOUNTER — Encounter (HOSPITAL_COMMUNITY): Payer: Self-pay | Admitting: *Deleted

## 2011-05-09 DIAGNOSIS — R6889 Other general symptoms and signs: Secondary | ICD-10-CM | POA: Insufficient documentation

## 2011-05-09 DIAGNOSIS — F319 Bipolar disorder, unspecified: Secondary | ICD-10-CM | POA: Insufficient documentation

## 2011-05-09 DIAGNOSIS — R0602 Shortness of breath: Secondary | ICD-10-CM | POA: Insufficient documentation

## 2011-05-09 DIAGNOSIS — K219 Gastro-esophageal reflux disease without esophagitis: Secondary | ICD-10-CM | POA: Insufficient documentation

## 2011-05-09 LAB — TROPONIN I: Troponin I: <0.3 ng/mL (ref <0.30)

## 2011-05-09 LAB — POCT I-STAT, CHEM 8
BUN: 12 mg/dL (ref 6–23)
Calcium, Ion: 1.14 mmol/L (ref 1.12–1.32)
Chloride: 103 mEq/L (ref 96–112)
Creatinine, Ser: 0.8 mg/dL (ref 0.50–1.10)
Glucose, Bld: 86 mg/dL (ref 70–99)
HCT: 38 % (ref 36.0–46.0)
Hemoglobin: 12.9 g/dL (ref 12.0–15.0)
Potassium: 3.5 mEq/L (ref 3.5–5.1)
Sodium: 140 mEq/L (ref 135–145)
TCO2: 26 mmol/L (ref 0–100)

## 2011-05-09 NOTE — ED Provider Notes (Signed)
History     CSN: [id number]  Arrival date & time 05/09/11  1728   First MD Initiated Contact with Patient 05/09/11 2018      Chief Complaint  Patient presents with  . Shortness of Breath    HPI: Patient is a 46 y.o. female presenting with shortness of breath. The history is provided by the patient.  Shortness of Breath  The current episode started today. The problem occurs frequently. The problem has been unchanged. The problem is mild. The symptoms are relieved by rest. The symptoms are aggravated by activity. Associated symptoms include shortness of breath. Pertinent negatives include no chest pain, no chest pressure, no fever, no stridor, no cough and no wheezing. There were no sick contacts.  Pt reports she had thyroidectomy on 05/07/2011. Last night she awoke feeling like she could not catch her breath. This episode resolved  After approx 15 min and was not associated w/ CP or other sx's. She admist that at last part of this sensation may have been related to the sensation of swelling (post-op) in her anterior neck. Then today she has noticed that she was would easily become SOB with even the slightest activity, most acutely when walking up stairs. She also reports persistent sensation like her throat is swollen and she is particularly aware of this when swallowing.   Past Medical History  Diagnosis Date  . PONV (postoperative nausea and vomiting)   . Seasonal allergies   . Thyroid mass   . Mental disorder     Bipolar  . Anxiety   . Depression   . Hypertension   . GERD (gastroesophageal reflux disease)   . Headache     has daily, migranes in past  . Suicide     attempt about 7 yrs ago    Past Surgical History  Procedure Date  . Cesarean section   . Mouth surgery   . Thyroidectomy 05/07/2011    Procedure: THYROIDECTOMY;  Surgeon: Susy Frizzle, MD;  Location: [facility] OR;  Service: ENT;  Laterality: Right;  RIGHT THYROID LOBECTOMY WITH FROZEN SECTION     History reviewed. No  pertinent family history.  History  Substance Use Topics  . Smoking status: Never Smoker   . Smokeless tobacco: Never Used  . Alcohol Use: Yes     occasionally    OB History    Grav Para Term Preterm Abortions TAB SAB Ect Mult Living                  Review of Systems  Constitutional: Negative.  Negative for fever.  HENT: Negative.   Eyes: Negative.   Respiratory: Positive for shortness of breath. Negative for cough, wheezing and stridor.   Cardiovascular: Negative.  Negative for chest pain.  Gastrointestinal: Negative.   Genitourinary: Negative.   Musculoskeletal: Negative.   Skin: Negative.   Neurological: Negative.   Hematological: Negative.   Psychiatric/Behavioral: Negative.     Allergies  Other  Home Medications   Current Outpatient Rx  Name Route Sig Dispense Refill  . CETIRIZINE HCL 10 MG PO TABS Oral Take 10 mg by mouth daily.      [name] FLUOXETINE HCL 20 MG PO CAPS Oral Take 20 mg by mouth daily.      [name] HYDROCODONE-ACETAMINOPHEN 5-325 MG PO TABS Oral Take 1-2 tablets by mouth every 4 (four) hours as needed. For pain    . LAMOTRIGINE 100 MG PO TABS Oral Take 300 mg by mouth at bedtime.      [name]  LEVOTHYROXINE SODIUM 25 MCG PO TABS Oral Take 25 mcg by mouth daily.      [name] LISDEXAMFETAMINE DIMESYLATE 70 MG PO CAPS Oral Take 70 mg by mouth every morning.      [name] LISINOPRIL 20 MG PO TABS Oral Take 20 mg by mouth daily.      [name] LITHIUM CARBONATE PO Oral Take 450-675 tablets by mouth 2 (two) times daily. Takes 1 tablet in the morning and 1.5 tablets at night.    Carma Leaven M PLUS PO TABS Oral Take 2 tablets by mouth daily. Chewable.     [place] Artist OP Ophthalmic Apply 1 drop to eye 2 (two) times daily as needed. For red eyes/irritation.     [name] ZEGERID OTC PO Oral Take 1 tablet by mouth at bedtime.      [name] RANITIDINE HCL 150 MG PO TABS Oral Take 150 mg by mouth every morning.      [name] PROMETHAZINE HCL 25 MG RE SUPP Rectal Place 25 mg rectally every 6 (six) hours as needed. For nausea       BP 114/70  Pulse 86  Temp(Src) 98.5 F (36.9 C) (Oral)  Resp 20  SpO2 97%  Physical Exam  Constitutional: She is oriented to person, place, and time. She appears well-developed and well-nourished.  HENT:  Head: Normocephalic and atraumatic.         Mild edema and erythema to surgical incision to anterior neck   Eyes: Conjunctivae are normal.  Neck: Neck supple.  Cardiovascular: Normal rate and regular rhythm.   Pulmonary/Chest: Effort normal and breath sounds normal.  Abdominal: Soft. Bowel sounds are normal.  Musculoskeletal: Normal range of motion.  Neurological: She is alert and oriented to person, place, and time.  Skin: Skin is warm and dry. No erythema.  Psychiatric: She has a normal mood and affect.    ED Course  Procedures  Date: 05/10/2011  Rate: 71  Rhythm: normal sinus rhythm  QRS Axis: Right BBB  Intervals: normal  ST/T Wave abnormalities: normal  Conduction Disutrbances:none  Narrative Interpretation: Unchanged from pre-op EKG w/ cardiologist on 05/06/2011  Old EKG Reviewed: unchanged Pt has rested in NAD while awaiting test results. Findings and clinical impression discussed w/ pt. Pt and spouse reassured. Discussed w/ pt that symptoms likely related to recent surgery and effects of anesthesia, etc. Will plan for d/c home and encourage pt to keep her scheduled f/u appointment w/ Dr Pollyann Kennedy on Wednesday as planned. Pt and spouse agreeable w/ plan. I have discussed pt w/ Dr Ranae Palms who is in agreement w/ plan.    Labs Reviewed  POCT I-STAT, CHEM 8  I-STAT, CHEM 8  TROPONIN I   Dg Chest 2 View  05/09/2011  *RADIOLOGY REPORT*  Clinical Data: Shortness of breath.  History of thyroid surgery 2 days ago.  CHEST - 2 VIEW  Comparison: 05/04/2011  Findings: Heart size is upper limits normal.  The lungs are free of focal consolidations and pleural effusions.  No pulmonary edema.  IMPRESSION: Negative exam.  Original Report Authenticated By: Patterson Hammersmith,  M.D.     No diagnosis found.    MDM  Pt is not tachypneac, tachycardic and sats 97% on R/A. Has continued to deny CP.   CXR normal, EKG unchgd from a preop EKG and Trop I neg. HPI/PE and clinical findings/course most c/w normal post-surgical fatigue and operative site swelling s/p thyroidectomy.         Leanne Chang, NP 05/11/11 [phone]  Leanne Chang, NP 05/11/11 [phone]

## 2011-05-09 NOTE — ED Notes (Signed)
Patient had thyroid surgery on Wednesday and today she started having shortness of breath and was worse when she was walking up the stairs.  Respiratory intact

## 2011-05-10 NOTE — ED Provider Notes (Signed)
History     CSN: [id number]  Arrival date & time 05/09/11  1728   First MD Initiated Contact with Patient 05/09/11 2018      Chief Complaint  Patient presents with  . Shortness of Breath    (Consider location/radiation/quality/duration/timing/severity/associated sxs/prior treatment) HPI  Past Medical History  Diagnosis Date  . PONV (postoperative nausea and vomiting)   . Seasonal allergies   . Thyroid mass   . Mental disorder     Bipolar  . Anxiety   . Depression   . Hypertension   . GERD (gastroesophageal reflux disease)   . Headache     has daily, migranes in past  . Suicide     attempt about 7 yrs ago    Past Surgical History  Procedure Date  . Cesarean section   . Mouth surgery   . Thyroidectomy 05/07/2011    Procedure: THYROIDECTOMY;  Surgeon: Susy Frizzle, MD;  Location: [facility] OR;  Service: ENT;  Laterality: Right;  RIGHT THYROID LOBECTOMY WITH FROZEN SECTION     History reviewed. No pertinent family history.  History  Substance Use Topics  . Smoking status: Never Smoker   . Smokeless tobacco: Never Used  . Alcohol Use: Yes     occasionally    OB History    Grav Para Term Preterm Abortions TAB SAB Ect Mult Living                  Review of Systems  Allergies  Other  Home Medications   Current Outpatient Rx  Name Route Sig Dispense Refill  . CETIRIZINE HCL 10 MG PO TABS Oral Take 10 mg by mouth daily.      [name] FLUOXETINE HCL 20 MG PO CAPS Oral Take 20 mg by mouth daily.      [name] HYDROCODONE-ACETAMINOPHEN 5-325 MG PO TABS Oral Take 1-2 tablets by mouth every 4 (four) hours as needed. For pain    . LAMOTRIGINE 100 MG PO TABS Oral Take 300 mg by mouth at bedtime.      [name] LEVOTHYROXINE SODIUM 25 MCG PO TABS Oral Take 25 mcg by mouth daily.      [name] LISDEXAMFETAMINE DIMESYLATE 70 MG PO CAPS Oral Take 70 mg by mouth every morning.      [name] LISINOPRIL 20 MG PO TABS Oral Take 20 mg by mouth daily.      [name] LITHIUM CARBONATE PO Oral Take 450-675 tablets by  mouth 2 (two) times daily. Takes 1 tablet in the morning and 1.5 tablets at night.    Carma Leaven M PLUS PO TABS Oral Take 2 tablets by mouth daily. Chewable.     [place] Artist OP Ophthalmic Apply 1 drop to eye 2 (two) times daily as needed. For red eyes/irritation.     [name] ZEGERID OTC PO Oral Take 1 tablet by mouth at bedtime.      [name] RANITIDINE HCL 150 MG PO TABS Oral Take 150 mg by mouth every morning.      [name] PROMETHAZINE HCL 25 MG RE SUPP Rectal Place 25 mg rectally every 6 (six) hours as needed. For nausea      BP 114/70  Pulse 86  Temp(Src) 98.5 F (36.9 C) (Oral)  Resp 20  SpO2 97%  Physical Exam  ED Course  Procedures (including critical care time)   Labs Reviewed  TROPONIN I  POCT I-STAT, CHEM 8  LAB REPORT - SCANNED  I-STAT, CHEM 8   Dg Neck Soft Tissue  05/09/2011  *  RADIOLOGY REPORT*  Clinical Data: Recent thyroid resection, difficulty breathing  NECK SOFT TISSUES - 1+ VIEW  Comparison: None  Findings: Prevertebral soft tissues normal thickness. Airway patent. Epiglottis and aryepiglottic folds normal appearance. Small focal gas collection identified in the anterior cervical region at the C5 level, question related to recent surgery. Disc space narrowing C5-C6.  IMPRESSION: Small focal gas collection 8 mm diameter identified in the anterior soft tissues of the neck at the C5 level, question related to recent surgery. Otherwise negative exam.  Original Report Authenticated By: Lollie Marrow, M.D.   Dg Chest 2 View  05/09/2011  *RADIOLOGY REPORT*  Clinical Data: Shortness of breath.  History of thyroid surgery 2 days ago.  CHEST - 2 VIEW  Comparison: 05/04/2011  Findings: Heart size is upper limits normal.  The lungs are free of focal consolidations and pleural effusions.  No pulmonary edema.  IMPRESSION: Negative exam.  Original Report Authenticated By: Patterson Hammersmith, M.D.     1. Shortness of breath       MDM          Loren Racer, MD 05/10/11 [phone]

## 2011-05-13 NOTE — ED Provider Notes (Signed)
Medical screening examination/treatment/procedure(s) were performed by non-physician practitioner and as supervising physician I was immediately available for consultation/collaboration.  Loren Racer, MD 05/13/11 [phone]

## 2011-06-08 ENCOUNTER — Other Ambulatory Visit: Payer: Self-pay | Admitting: Endocrinology

## 2011-06-08 DIAGNOSIS — E041 Nontoxic single thyroid nodule: Secondary | ICD-10-CM

## 2011-07-08 ENCOUNTER — Ambulatory Visit: Payer: BC Managed Care – PPO | Attending: Otolaryngology

## 2011-07-08 DIAGNOSIS — R490 Dysphonia: Secondary | ICD-10-CM | POA: Insufficient documentation

## 2011-07-08 DIAGNOSIS — IMO0001 Reserved for inherently not codable concepts without codable children: Secondary | ICD-10-CM | POA: Insufficient documentation

## 2011-07-20 ENCOUNTER — Ambulatory Visit: Payer: BC Managed Care – PPO

## 2011-08-04 ENCOUNTER — Ambulatory Visit: Payer: BC Managed Care – PPO | Attending: Otolaryngology

## 2011-08-04 DIAGNOSIS — R490 Dysphonia: Secondary | ICD-10-CM | POA: Insufficient documentation

## 2011-08-04 DIAGNOSIS — IMO0001 Reserved for inherently not codable concepts without codable children: Secondary | ICD-10-CM | POA: Insufficient documentation

## 2011-11-30 ENCOUNTER — Other Ambulatory Visit: Payer: BC Managed Care – PPO

## 2011-12-16 ENCOUNTER — Ambulatory Visit
Admission: RE | Admit: 2011-12-16 | Discharge: 2011-12-16 | Disposition: A | Discharge status: Home / Self Care | Payer: BC Managed Care – PPO | Source: Ambulatory Visit | Attending: Endocrinology | Admitting: Endocrinology

## 2011-12-16 DIAGNOSIS — E041 Nontoxic single thyroid nodule: Secondary | ICD-10-CM

## 2011-12-29 ENCOUNTER — Other Ambulatory Visit: Payer: Self-pay | Admitting: Endocrinology

## 2011-12-29 DIAGNOSIS — E041 Nontoxic single thyroid nodule: Secondary | ICD-10-CM

## 2011-12-30 ENCOUNTER — Other Ambulatory Visit (HOSPITAL_COMMUNITY)
Admission: RE | Admit: 2011-12-30 | Discharge: 2011-12-30 | Disposition: A | Discharge status: Home / Self Care | Payer: BC Managed Care – PPO | Source: Ambulatory Visit | Attending: Interventional Radiology | Admitting: Interventional Radiology

## 2011-12-30 ENCOUNTER — Ambulatory Visit
Admission: RE | Admit: 2011-12-30 | Discharge: 2011-12-30 | Disposition: A | Discharge status: Home / Self Care | Payer: BC Managed Care – PPO | Source: Ambulatory Visit | Attending: Endocrinology | Admitting: Endocrinology

## 2011-12-30 DIAGNOSIS — E041 Nontoxic single thyroid nodule: Secondary | ICD-10-CM

## 2011-12-30 DIAGNOSIS — E049 Nontoxic goiter, unspecified: Secondary | ICD-10-CM | POA: Insufficient documentation

## 2012-09-16 ENCOUNTER — Telehealth: Payer: Self-pay | Admitting: Cardiology

## 2012-09-16 NOTE — Telephone Encounter (Signed)
Discussed Ekg with patient

## 2012-09-16 NOTE — Telephone Encounter (Signed)
New Problem:    Patient called in concerned because she had an ECHO performed at [facility] in preparation for her upcoming procedure and saw some verbage that concerned her.  Please call back.

## 2013-03-27 ENCOUNTER — Other Ambulatory Visit: Payer: Self-pay | Admitting: Obstetrics and Gynecology

## 2013-04-24 IMAGING — US US THYROID BIOPSY
1 series · 9 of 9 positions shown · non-contrast
Comparison: Thyroid ultrasound dated 12/16/2011

CLINICAL DATA: Enlargement of left thyroid nodule.  Status post
right thyroidectomy.

ULTRASOUND GUIDED NEEDLE ASPIRATE BIOPSY OF THE THYROID GLAND

[Series 1: us thyroid biopsy · 0.06mm/px · 9 acquisitions, 9 frames shown]
[im 1/9]
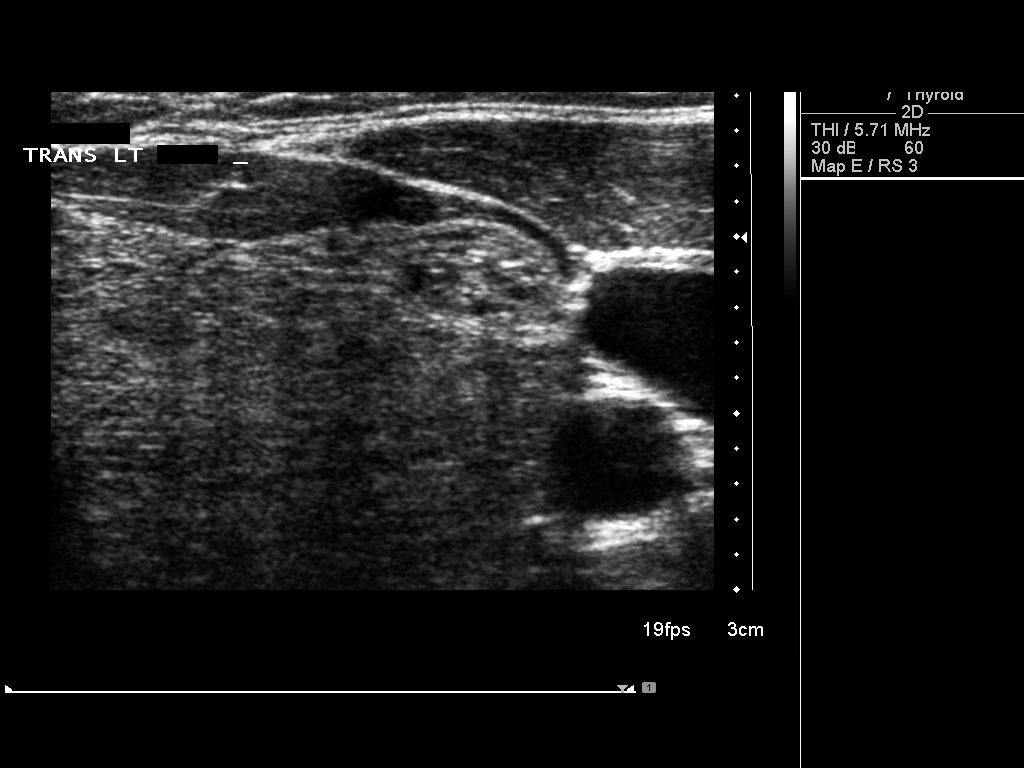
[im 2/9]
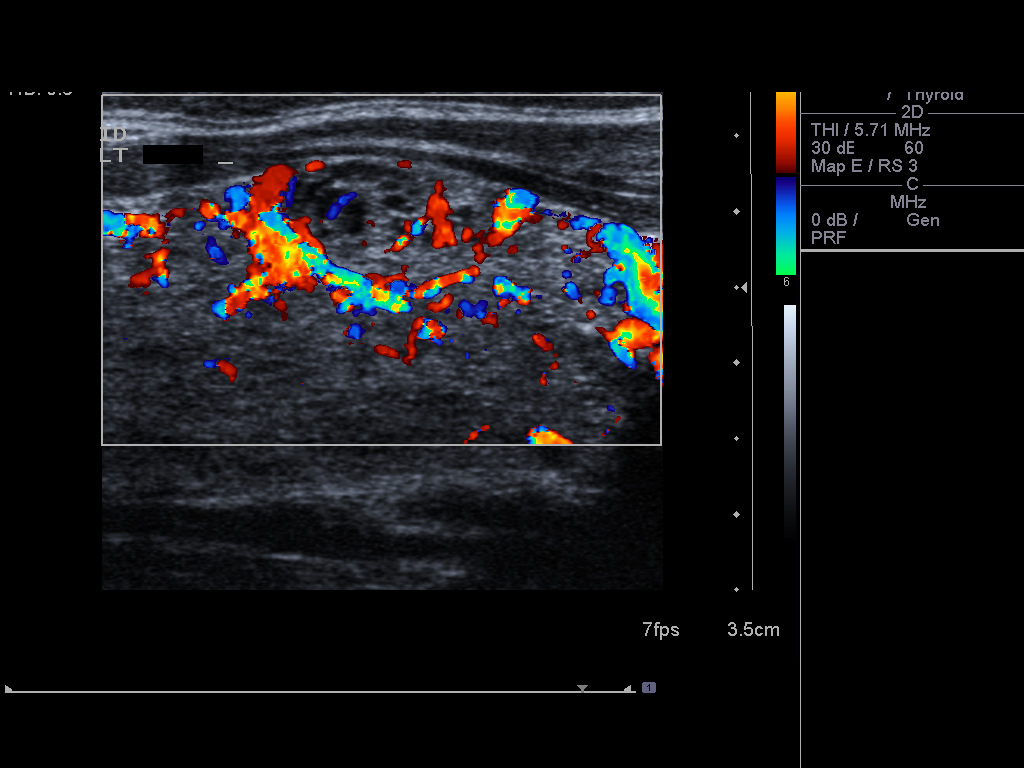
[im 3/9]
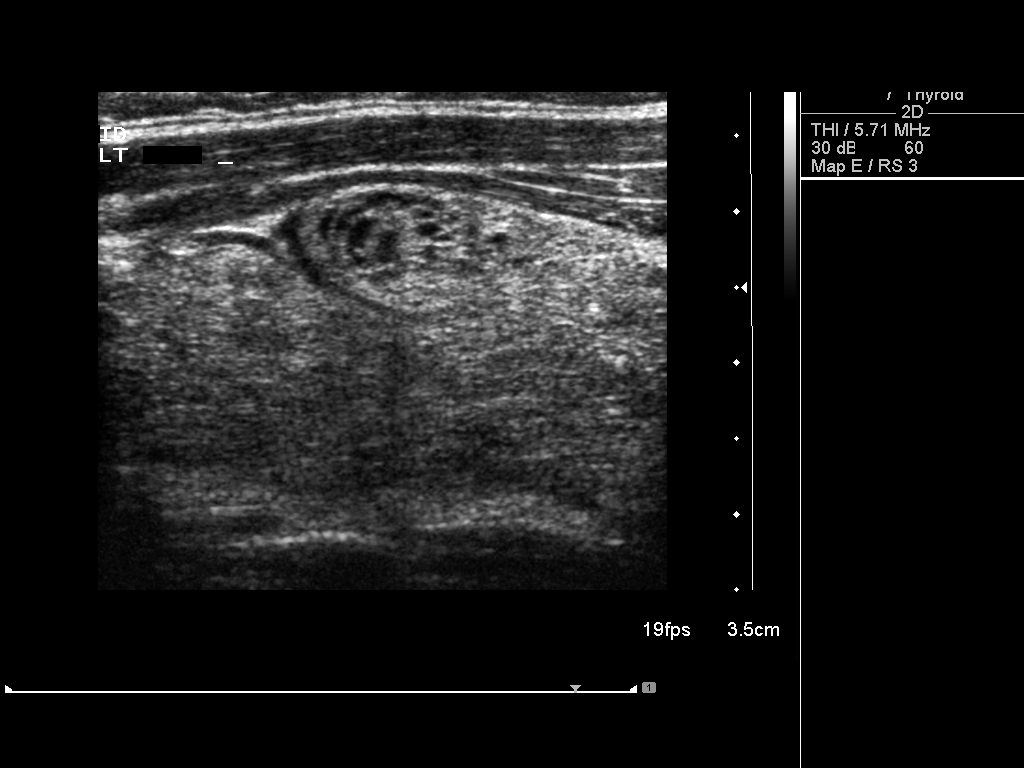
[im 4/9]
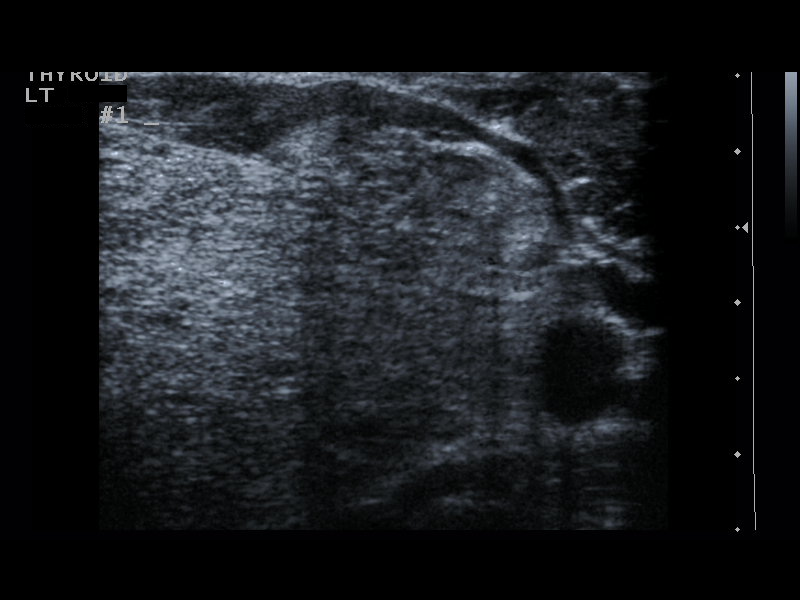
[im 5/9]
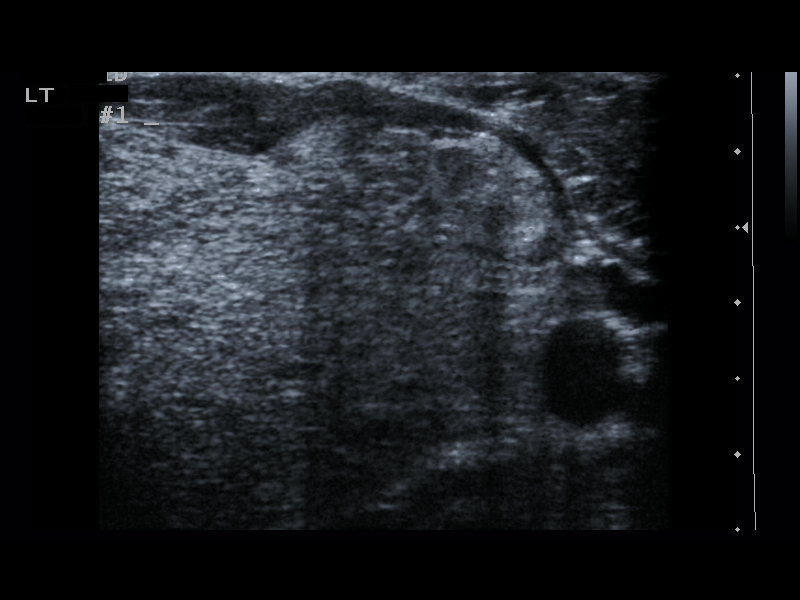
[im 6/9]
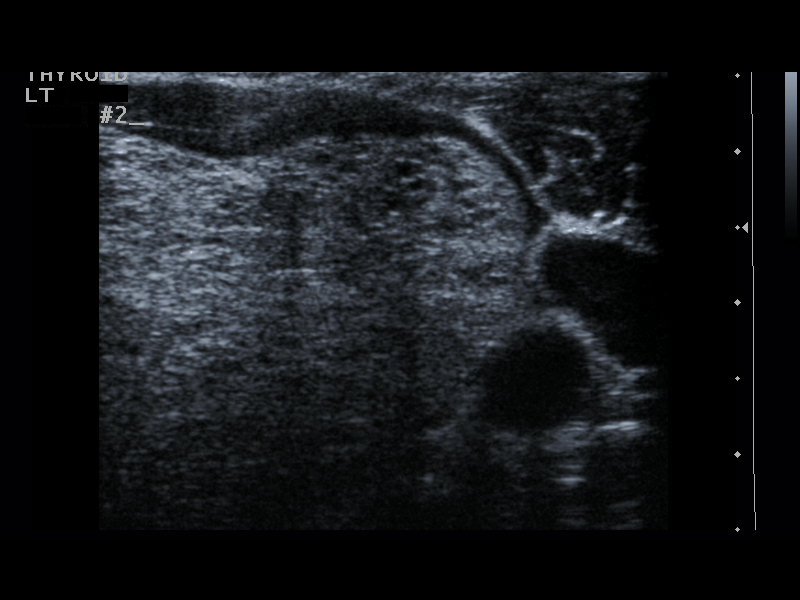
[im 7/9]
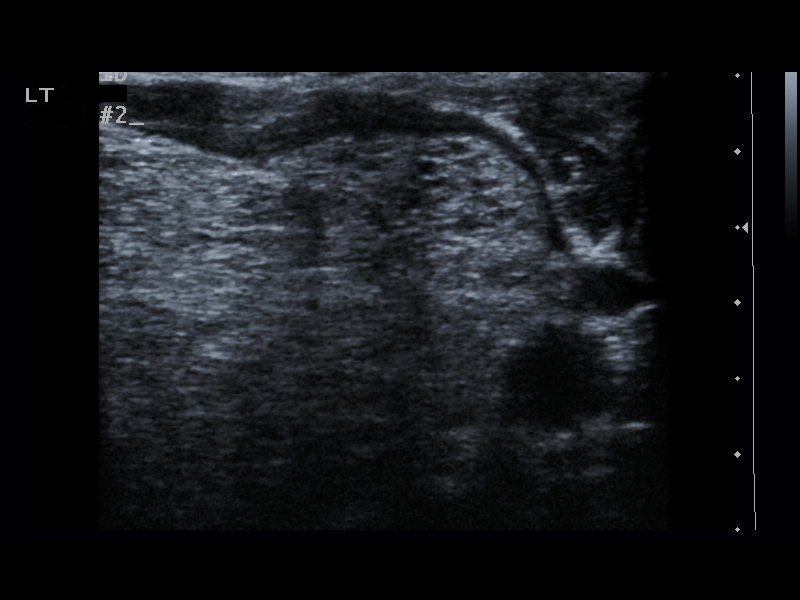
[im 8/9]
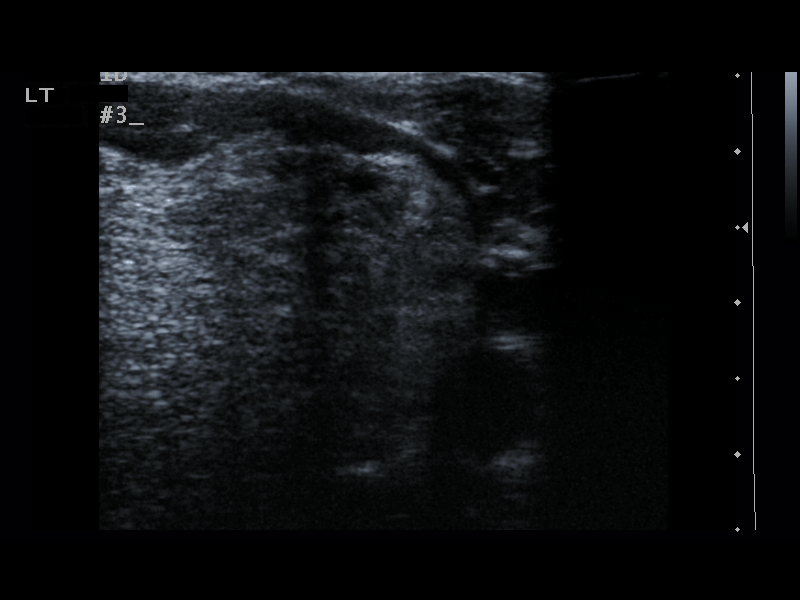
[im 9/9]
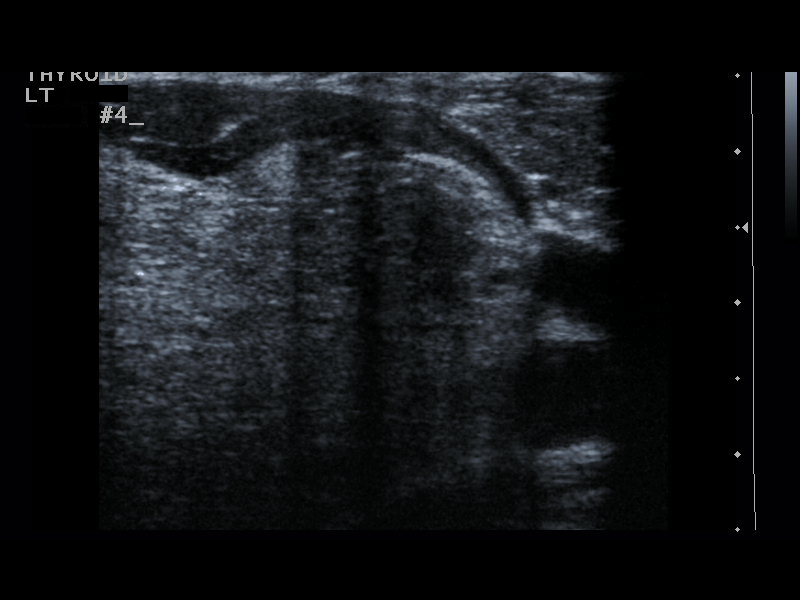

[9 of 9 positions shown; findings below may reference images not displayed]

Thyroid biopsy was thoroughly discussed with the patient and
questions were answered.  The benefits, risks, alternatives, and
complications were also discussed.  The patient understands and
wishes to proceed with the procedure.  Written consent was
obtained.

Ultrasound was performed to localize and mark an adequate site for
the biopsy.  The patient was then prepped and draped in a normal
sterile fashion.  Local anesthesia was provided with 1% lidocaine.
Using direct ultrasound guidance, 4 passes were made using 25 gauge
needles into the nodule within the left lobe of the thyroid.
Ultrasound was used to confirm needle placements on all occasions.
Specimens were sent to Pathology for analysis.

Complications:  None
FINDINGS: The roughly 1.5 cm thyroid nodule was localized in the
left lobe.  Needle samples were obtained directly in the lesion.
IMPRESSION: Ultrasound guided needle aspirate biopsy performed of the left
thyroid nodule.

## 2014-06-01 ENCOUNTER — Other Ambulatory Visit: Payer: Self-pay | Admitting: Obstetrics and Gynecology

## 2014-06-04 LAB — CYTOLOGY - PAP

## 2014-06-05 ENCOUNTER — Other Ambulatory Visit: Payer: Self-pay | Admitting: Obstetrics and Gynecology

## 2014-06-05 DIAGNOSIS — R928 Other abnormal and inconclusive findings on diagnostic imaging of breast: Secondary | ICD-10-CM

## 2014-06-14 ENCOUNTER — Ambulatory Visit
Admission: RE | Admit: 2014-06-14 | Discharge: 2014-06-14 | Disposition: A | Discharge status: Home / Self Care | Payer: Self-pay | Source: Ambulatory Visit | Attending: Obstetrics and Gynecology | Admitting: Obstetrics and Gynecology

## 2014-06-14 ENCOUNTER — Ambulatory Visit
Admission: RE | Admit: 2014-06-14 | Discharge: 2014-06-14 | Disposition: A | Discharge status: Home / Self Care | Payer: BLUE CROSS/BLUE SHIELD | Source: Ambulatory Visit | Attending: Obstetrics and Gynecology | Admitting: Obstetrics and Gynecology

## 2014-06-14 ENCOUNTER — Encounter (INDEPENDENT_AMBULATORY_CARE_PROVIDER_SITE_OTHER): Payer: Self-pay

## 2014-06-14 DIAGNOSIS — R928 Other abnormal and inconclusive findings on diagnostic imaging of breast: Secondary | ICD-10-CM

## 2015-06-03 ENCOUNTER — Ambulatory Visit: Payer: Self-pay | Admitting: Orthopedic Surgery

## 2015-06-03 NOTE — Progress Notes (Signed)
Preoperative surgical orders have been place into the Epic hospital system for [facility] on 06/03/2015, 11:03 AM  by Patrica Duel for surgery on 07-03-15.  Preop Knee Scope orders including IV Tylenol and IV Decadron as long as there are no contraindications to the above medications. Cynthia Peace, PA-C

## 2015-07-03 ENCOUNTER — Ambulatory Visit: Admit: 2015-07-03 | Payer: BLUE CROSS/BLUE SHIELD | Admitting: Orthopedic Surgery

## 2015-07-03 SURGERY — ARTHROSCOPY, KNEE
Anesthesia: Choice | Site: Knee | Laterality: Right

## 2015-07-17 ENCOUNTER — Other Ambulatory Visit: Payer: Self-pay | Admitting: Obstetrics and Gynecology

## 2015-07-17 DIAGNOSIS — N6489 Other specified disorders of breast: Secondary | ICD-10-CM

## 2015-07-23 ENCOUNTER — Ambulatory Visit
Admission: RE | Admit: 2015-07-23 | Discharge: 2015-07-23 | Disposition: A | Discharge status: Home / Self Care | Payer: 59 | Source: Ambulatory Visit | Attending: Obstetrics and Gynecology | Admitting: Obstetrics and Gynecology

## 2015-07-23 DIAGNOSIS — N6489 Other specified disorders of breast: Secondary | ICD-10-CM

## 2018-02-28 ENCOUNTER — Other Ambulatory Visit: Payer: Self-pay

## 2018-03-01 ENCOUNTER — Other Ambulatory Visit: Payer: Self-pay

## 2018-03-01 MED ORDER — LITHIUM CARBONATE ER 450 MG PO TBCR: EXTENDED_RELEASE_TABLET | ORAL | 0 refills | Status: DC

## 2018-03-01 MED ORDER — FLUOXETINE HCL 20 MG PO CAPS: 20.0000 mg | ORAL_CAPSULE | Freq: Every day | ORAL | 0 refills | Status: DC

## 2018-03-02 ENCOUNTER — Other Ambulatory Visit: Payer: Self-pay

## 2018-03-02 MED ORDER — LAMOTRIGINE 200 MG PO TABS: 200.0000 mg | ORAL_TABLET | Freq: Every day | ORAL | 0 refills | Status: DC

## 2018-03-25 ENCOUNTER — Encounter: Payer: Self-pay | Admitting: Emergency Medicine

## 2018-03-25 DIAGNOSIS — F988 Other specified behavioral and emotional disorders with onset usually occurring in childhood and adolescence: Secondary | ICD-10-CM | POA: Insufficient documentation

## 2018-03-25 DIAGNOSIS — F319 Bipolar disorder, unspecified: Secondary | ICD-10-CM | POA: Insufficient documentation

## 2018-03-25 DIAGNOSIS — F4010 Social phobia, unspecified: Secondary | ICD-10-CM | POA: Insufficient documentation

## 2018-04-07 ENCOUNTER — Ambulatory Visit: Payer: Self-pay | Admitting: Psychiatry

## 2018-05-03 ENCOUNTER — Other Ambulatory Visit: Payer: Self-pay | Admitting: Psychiatry

## 2018-05-03 NOTE — Telephone Encounter (Signed)
Need to review paper chart to check on last ov

## 2018-05-29 ENCOUNTER — Other Ambulatory Visit: Payer: Self-pay | Admitting: Psychiatry

## 2018-05-31 ENCOUNTER — Telehealth: Payer: Self-pay | Admitting: Psychiatry

## 2018-05-31 NOTE — Telephone Encounter (Signed)
Please review note

## 2018-05-31 NOTE — Telephone Encounter (Signed)
Requesting appt.

## 2018-05-31 NOTE — Telephone Encounter (Signed)
Ok 11 am 2/24

## 2018-05-31 NOTE — Telephone Encounter (Signed)
Patient requesting a work in appt.on 2/24 or 2/25. Stated she lives in [facility] and she and her husband will be in town on those days. Stated she hasn't been in due to spine surgery,and she has a pending knee surgery scheduled. Chart in your box if needed.

## 2018-06-20 ENCOUNTER — Ambulatory Visit (INDEPENDENT_AMBULATORY_CARE_PROVIDER_SITE_OTHER): Payer: 59 | Admitting: Psychiatry

## 2018-06-20 ENCOUNTER — Encounter: Payer: Self-pay | Admitting: Psychiatry

## 2018-06-20 VITALS — BP 153/97 | HR 84

## 2018-06-20 DIAGNOSIS — F902 Attention-deficit hyperactivity disorder, combined type: Secondary | ICD-10-CM

## 2018-06-20 DIAGNOSIS — F3181 Bipolar II disorder: Secondary | ICD-10-CM | POA: Diagnosis not present

## 2018-06-20 DIAGNOSIS — F4010 Social phobia, unspecified: Secondary | ICD-10-CM | POA: Diagnosis not present

## 2018-06-20 MED ORDER — LAMOTRIGINE ER 200 MG PO TB24: 200.0000 mg | ORAL_TABLET | Freq: Every day | ORAL | 1 refills | Status: DC

## 2018-06-20 MED ORDER — LISDEXAMFETAMINE DIMESYLATE 70 MG PO CAPS: 70.0000 mg | ORAL_CAPSULE | Freq: Every day | ORAL | 0 refills | Status: DC

## 2018-06-20 MED ORDER — PROPRANOLOL HCL 20 MG PO TABS: 20.0000 mg | ORAL_TABLET | Freq: Two times a day (BID) | ORAL | 1 refills | Status: DC

## 2018-06-20 MED ORDER — FLUOXETINE HCL 20 MG PO CAPS: 20.0000 mg | ORAL_CAPSULE | Freq: Every day | ORAL | 3 refills | Status: DC

## 2018-06-20 MED ORDER — QUETIAPINE FUMARATE 25 MG PO TABS: 25.0000 mg | ORAL_TABLET | Freq: Every evening | ORAL | 1 refills | Status: DC | PRN

## 2018-06-20 MED ORDER — LISDEXAMFETAMINE DIMESYLATE 70 MG PO CAPS: 70.0000 mg | ORAL_CAPSULE | ORAL | 0 refills | Status: DC

## 2018-06-20 MED ORDER — LITHIUM CARBONATE ER 450 MG PO TBCR: EXTENDED_RELEASE_TABLET | ORAL | 0 refills | Status: DC

## 2018-06-20 NOTE — Progress Notes (Signed)
Cynthia Hogan [id number] Dec 17, 1965 53 y.o.  Subjective:   Patient ID:  Cynthia Hogan is a 53 y.o. (DOB [birth year]) female.  Chief Complaint:  Chief Complaint  Patient presents with  . Follow-up    Medication Management  . Sleeping Problem   Last seen June HPI Cynthia Hogan presents to the office today for follow-up of bipolar depression and anxiety and ADD.  Cervical surgery October and pending knee surgery March.  Neck brace for 3 1/2 mos.  Patient reports stable mood and denies depressed or irritable moods.  Patient denies any recent difficulty with anxiety.  Patient denies difficulty with sleep initiation or maintenance. Denies appetite disturbance.  Patient reports that energy and motivation have been good.  Patient denies any difficulty with concentration.  Patient denies any suicidal ideation. Trouble sleeping with the neck brace and trying to get back into routine.  Tried blue-blocking glasses for awhile and notice she got tired more easily but didn't stay asleep longer.  Does use them and sees a difference.     Review of Systems:  Review of Systems  Musculoskeletal: Positive for arthralgias.  Neurological: Negative for tremors and weakness.  Psychiatric/Behavioral: Negative for agitation, behavioral problems, confusion, decreased concentration, dysphoric mood, hallucinations, self-injury, sleep disturbance and suicidal ideas. The patient is not nervous/anxious and is not hyperactive.     Medications: I have reviewed the patient's current medications.  Current Outpatient Medications  Medication Sig Dispense Refill  . cetirizine (ZYRTEC) 10 MG tablet Take 10 mg by mouth daily.      [name] FLUoxetine (PROZAC) 20 MG capsule Take 1 capsule (20 mg total) by mouth daily. 90 capsule 3  . irbesartan (AVAPRO) 150 MG tablet     . LamoTRIgine 200 MG TB24 24 hour tablet Take 1 tablet (200 mg total) by mouth daily. 90 tablet 1  . lisdexamfetamine (VYVANSE) 70 MG capsule Take  1 capsule (70 mg total) by mouth every morning. 30 capsule 0  . lithium carbonate (ESKALITH) 450 MG CR tablet Take 1/2 tablet every morning and 2 tablets at bedtime. 225 tablet 0  . LORazepam (ATIVAN) 0.5 MG tablet Take 0.5 mg by mouth 2 (two) times daily.    . Multiple Vitamins-Minerals (MULTIVITAMINS THER. W/MINERALS) TABS Take 2 tablets by mouth daily. Chewable.     [name] Bicarbonate (ZEGERID OTC PO) Take 1 tablet by mouth at bedtime.      . propranolol (INDERAL) 20 MG tablet Take 1 tablet (20 mg total) by mouth 2 (two) times daily. 180 tablet 1  . QUEtiapine (SEROQUEL) 25 MG tablet Take 1 tablet (25 mg total) by mouth at bedtime as needed. 90 tablet 1  . [START ON 07/18/2018] lisdexamfetamine (VYVANSE) 70 MG capsule Take 1 capsule (70 mg total) by mouth daily. 30 capsule 0  . [START ON 08/15/2018] lisdexamfetamine (VYVANSE) 70 MG capsule Take 1 capsule (70 mg total) by mouth daily. 30 capsule 0   No current facility-administered medications for this visit.     Medication Side Effects: None  Allergies:  Allergies  Allergen Reactions  . Other     Anesthesia med makes pt very ill. Pt not sure which medication it was.  . Tetracycline     Past Medical History:  Diagnosis Date  . Anxiety   . Depression   . GERD (gastroesophageal reflux disease)   . Headache(784.0)    has daily, migranes in past  . Hypertension   . Mental disorder    Bipolar  . PONV (  postoperative nausea and vomiting)   . Seasonal allergies   . Suicide (HCC)    attempt about 7 yrs ago  . Thyroid mass     History reviewed. No pertinent family history.  Social History   Socioeconomic History  . Marital status: Married    Spouse name: Not on file  . Number of children: Not on file  . Years of education: Not on file  . Highest education level: Not on file  Occupational History  . Not on file  Social Needs  . Financial resource strain: Not on file  . Food insecurity:    Worry: Not on file     Inability: Not on file  . Transportation needs:    Medical: Not on file    Non-medical: Not on file  Tobacco Use  . Smoking status: Never Smoker  . Smokeless tobacco: Never Used  Substance and Sexual Activity  . Alcohol use: Yes    Comment: occasionally  . Drug use: Not on file  . Sexual activity: Yes    Birth control/protection: I.U.D.  Lifestyle  . Physical activity:    Days per week: Not on file    Minutes per session: Not on file  . Stress: Not on file  Relationships  . Social connections:    Talks on phone: Not on file    Gets together: Not on file    Attends religious service: Not on file    Active member of club or organization: Not on file    Attends meetings of clubs or organizations: Not on file    Relationship status: Not on file  . Intimate partner violence:    Fear of current or ex partner: Not on file    Emotionally abused: Not on file    Physically abused: Not on file    Forced sexual activity: Not on file  Other Topics Concern  . Not on file  Social History Narrative  . Not on file    Past Medical History, Surgical history, Social history, and Family history were reviewed and updated as appropriate.   Living in [facility].  Please see review of systems for further details on the patient's review from today.   Objective:   Physical Exam:  BP (!) 153/97 (BP Location: Left Arm)   Pulse 84   Physical Exam Constitutional:      General: She is not in acute distress.    Appearance: She is well-developed.  Musculoskeletal:        General: No deformity.  Neurological:     Mental Status: She is alert and oriented to person, place, and time.     Motor: No tremor.     Coordination: Coordination normal.     Gait: Gait normal.  Psychiatric:        Attention and Perception: Attention normal. She is attentive.        Mood and Affect: Mood normal. Mood is not anxious or depressed. Affect is not labile, blunt, angry or inappropriate.        Speech: Speech normal.         Behavior: Behavior normal.        Thought Content: Thought content normal. Thought content does not include homicidal or suicidal ideation. Thought content does not include homicidal or suicidal plan.        Cognition and Memory: Cognition normal.        Judgment: Judgment normal.     Comments: Insight is good.     Lab  Review:     Component Value Date/Time   NA 140 05/09/2011 2137   K 3.5 05/09/2011 2137   CL 103 05/09/2011 2137   CO2 28 05/04/2011 0947   GLUCOSE 86 05/09/2011 2137   BUN 12 05/09/2011 2137   CREATININE 0.80 05/09/2011 2137   CALCIUM 10.2 05/04/2011 0947   GFRNONAA >90 05/04/2011 0947   GFRAA >90 05/04/2011 0947       Component Value Date/Time   WBC 12.3 (H) 05/04/2011 0947   RBC 4.59 05/04/2011 0947   HGB 12.9 05/09/2011 2137   HCT 38.0 05/09/2011 2137   PLT 332 05/04/2011 0947   MCV 88.7 05/04/2011 0947   MCH 28.8 05/04/2011 0947   MCHC 32.4 05/04/2011 0947   RDW 13.5 05/04/2011 0947    No results found for: POCLITH, LITHIUM   No results found for: PHENYTOIN, PHENOBARB, VALPROATE, CBMZ   .res Assessment: Plan:    Bipolar II disorder (HCC)  Attention deficit hyperactivity disorder (ADHD), combined type  Social anxiety disorder   Check lithium level.  Counseled patient regarding potential benefits, risks, and side effects of lithium to include potential risk of lithium affecting thyroid and renal function.  Discussed need for periodic lab monitoring to determine drug level and to assess for potential adverse effects.  Counseled patient regarding signs and symptoms of lithium toxicity and advised that they notify office immediately or seek urgent medical attention if experiencing these signs and symptoms.  Patient advised to contact office with any questions or concerns.  ADD and anxiety managed.  FU 6 mos  Meredith Staggers, MD, DFAPA    Please see After Visit Summary for patient specific instructions.  No future appointments.  No  orders of the defined types were placed in this encounter.     -------------------------------

## 2018-07-13 LAB — LITHIUM LEVEL: Lithium Lvl: 0.6 mmol/L (ref 0.6–1.2)

## 2018-08-03 ENCOUNTER — Other Ambulatory Visit: Payer: Self-pay

## 2018-08-03 MED ORDER — LAMOTRIGINE 200 MG PO TABS: 200.0000 mg | ORAL_TABLET | Freq: Every day | ORAL | 1 refills | Status: DC

## 2018-08-10 ENCOUNTER — Telehealth: Payer: Self-pay

## 2018-08-10 NOTE — Telephone Encounter (Signed)
Prior authorization submitted for Vyvanse 70 mg through [employer]: [id number]. VYVANSE CAP 70MG  is approved through 08/10/2019.

## 2018-09-07 ENCOUNTER — Other Ambulatory Visit: Payer: Self-pay | Admitting: Psychiatry

## 2018-09-08 NOTE — Telephone Encounter (Signed)
Didn't have anything on the PMP for her. According to pharmacy last fill 2018?

## 2018-09-27 ENCOUNTER — Telehealth: Payer: Self-pay | Admitting: Psychiatry

## 2018-09-27 NOTE — Telephone Encounter (Signed)
Patient had knee surgery and also diagnosed with ALS patient not mobile right now and would like a phone visit with Dr. Jennelle Human regarding medications

## 2018-09-28 NOTE — Telephone Encounter (Signed)
ERROR

## 2018-09-28 NOTE — Telephone Encounter (Signed)
Put her on cancellation list for 30 min ASAP

## 2018-10-03 ENCOUNTER — Telehealth: Payer: Self-pay | Admitting: Psychiatry

## 2018-10-03 NOTE — Telephone Encounter (Signed)
Pt recently diagnosed with ALS. Needs to speak with you about meds.

## 2018-10-11 ENCOUNTER — Other Ambulatory Visit: Payer: Self-pay

## 2018-10-11 ENCOUNTER — Encounter: Payer: Self-pay | Admitting: Psychiatry

## 2018-10-11 ENCOUNTER — Ambulatory Visit (INDEPENDENT_AMBULATORY_CARE_PROVIDER_SITE_OTHER): Payer: 59 | Admitting: Psychiatry

## 2018-10-11 DIAGNOSIS — F902 Attention-deficit hyperactivity disorder, combined type: Secondary | ICD-10-CM | POA: Diagnosis not present

## 2018-10-11 DIAGNOSIS — F3181 Bipolar II disorder: Secondary | ICD-10-CM

## 2018-10-11 DIAGNOSIS — F4010 Social phobia, unspecified: Secondary | ICD-10-CM | POA: Diagnosis not present

## 2018-10-11 DIAGNOSIS — F5105 Insomnia due to other mental disorder: Secondary | ICD-10-CM | POA: Diagnosis not present

## 2018-10-11 DIAGNOSIS — F4322 Adjustment disorder with anxiety: Secondary | ICD-10-CM

## 2018-10-11 MED ORDER — DIAZEPAM 10 MG PO TABS: 10.0000 mg | ORAL_TABLET | Freq: Every evening | ORAL | 1 refills | Status: DC | PRN

## 2018-10-11 MED ORDER — QUETIAPINE FUMARATE 100 MG PO TABS: 200.0000 mg | ORAL_TABLET | Freq: Every day | ORAL | 3 refills | Status: DC

## 2018-10-11 NOTE — Progress Notes (Signed)
Cynthia Hogan [id number] 08/17/1965 53 y.o.  Virtual Visit via Telephone Note  I connected with@ on 10/11/18 at  3:30 PM EDT by telephone and verified that I am speaking with the correct person using two identifiers.   I discussed the limitations, risks, security and privacy concerns of performing an evaluation and management service by telephone and the availability of in person appointments. I also discussed with the patient that there may be a patient responsible charge related to this service. The patient expressed understanding and agreed to proceed.   I discussed the assessment and treatment plan with the patient. The patient was provided an opportunity to ask questions and all were answered. The patient agreed with the plan and demonstrated an understanding of the instructions.   The patient was advised to call back or seek an in-person evaluation if the symptoms worsen or if the condition fails to improve as anticipated.  I provided 30 minutes of non-face-to-face time during this encounter.  The patient was located at home.  [facility]   Lauraine Rinnearey G Cottle Jr, MD   Subjective:   Patient ID:  Cynthia Hogan is a 53 y.o. (DOB 05/21/1965) female.  Chief Complaint:  Chief Complaint  Patient presents with  . Follow-up    Medication Management  . ADD    Medication Management  . Anxiety    Medication Management  . Depression    Medication Management    HPI Cynthia BersBeth Ann Anger presents for follow-up of bipolar disorder.  Last seen June 20, 2018.  No meds were changed.  Lithium level was ordered. March 6 was 0.6.  Shocking news re DX ALS. Knee surgery 4 week ago.  [name] [place] and is in process..  Hard to get off pain meds after 2 weeks.  Caused some depression.  Right now in 1st stage of grief.  Can't believe it.  Haven't accepted it yet.  Plans to get a counselor.    Hard time sleeping.  Can be  delayed until 3-5 AM.  Will sleep sometimes  Later but only averaging 4 hours.  Some nights racing thought when can't go to sleep.  Tired.  Not usually a lot of anxiety about sleep but the last week she is dreading going to sleep.  Seroquel often fails.  Patient reports mood is shocked over diagnosis.  Patient denies any recent difficulty with anxiety.  Patient denies difficulty with sleep initiation or maintenance. Denies appetite disturbance.  Patient reports that energy and motivation have been good.  Patient denies any difficulty with concentration.  Patient denies any suicidal ideation.   Past Psychiatric Medication Trials:  Vistaril trazodone NR, Wellbutrin, citalopram, Zoloft, fluoxetine, Buspirone, Belsomra,  Ativan no sleepiness, Seroquel 50 , doxepin, lithium, lamotrigine, Wellbutrin, Geodon, Abilify, Cytomel Vyvanse, Concerta, Focalin,  NAC, Ritalin, Metadate,  Hx suicide attempt Xanax  Review of Systems:  Review of Systems  Neurological: Positive for weakness. Negative for tremors.    Medications: I have reviewed the patient's current medications.  Current Outpatient Medications  Medication Sig Dispense Refill  . cetirizine (ZYRTEC) 10 MG tablet Take 10 mg by mouth daily.      [name]. FLUoxetine (PROZAC) 20 MG capsule Take 1 capsule (20 mg total) by mouth daily. 90 capsule 3  . irbesartan (AVAPRO) 150 MG tablet     . lamoTRIgine (LAMICTAL) 200 MG tablet Take 1 tablet (200 mg total) by mouth daily. 90 tablet 1  .  lisdexamfetamine (VYVANSE) 70 MG capsule Take 1 capsule (70 mg total) by mouth every morning. 30 capsule 0  . lisdexamfetamine (VYVANSE) 70 MG capsule Take 1 capsule (70 mg total) by mouth daily. 30 capsule 0  . lisdexamfetamine (VYVANSE) 70 MG capsule Take 1 capsule (70 mg total) by mouth daily. 30 capsule 0  . lithium carbonate (ESKALITH) 450 MG CR tablet Take 1/2 tablet every morning and 2 tablets at bedtime. 225 tablet 0  . LORazepam (ATIVAN) 0.5 MG tablet TAKE 1 TO 2  TABLETS BY MOUTH TWICE A DAY AS NEEDED FOR ANXIETY 100 tablet 0  . Multiple Vitamins-Minerals (MULTIVITAMINS THER. W/MINERALS) TABS Take 2 tablets by mouth daily. Chewable.     [name]. Omeprazole-Sodium Bicarbonate (ZEGERID OTC PO) Take 1 tablet by mouth at bedtime.      . propranolol (INDERAL) 20 MG tablet Take 1 tablet (20 mg total) by mouth 2 (two) times daily. 180 tablet 1  . QUEtiapine (SEROQUEL) 100 MG tablet Take 2 tablets (200 mg total) by mouth at bedtime. 60 tablet 3  . riluzole (RILUTEK) 50 MG tablet Take 50 mg by mouth 2 (two) times daily.    . diazepam (VALIUM) 10 MG tablet Take 1 tablet (10 mg total) by mouth at bedtime as needed for anxiety or sleep. 30 tablet 1   No current facility-administered medications for this visit.     Medication Side Effects: None  Allergies:  Allergies  Allergen Reactions  . Other     Anesthesia med makes pt very ill. Pt not sure which medication it was.  . Tetracycline     Past Medical History:  Diagnosis Date  . Anxiety   . Depression   . GERD (gastroesophageal reflux disease)   . Headache(784.0)    has daily, migranes in past  . Hypertension   . Mental disorder    Bipolar  . PONV (postoperative nausea and vomiting)   . Seasonal allergies   . Suicide (HCC)    attempt about 7 yrs ago  . Thyroid mass     History reviewed. No pertinent family history.  Social History   Socioeconomic History  . Marital status: Married    Spouse name: Not on file  . Number of children: Not on file  . Years of education: Not on file  . Highest education level: Not on file  Occupational History  . Not on file  Social Needs  . Financial resource strain: Not on file  . Food insecurity    Worry: Not on file    Inability: Not on file  . Transportation needs    Medical: Not on file    Non-medical: Not on file  Tobacco Use  . Smoking status: Never Smoker  . Smokeless tobacco: Never Used  Substance and Sexual Activity  . Alcohol use: Yes     Comment: occasionally  . Drug use: Not on file  . Sexual activity: Yes    Birth control/protection: I.U.D.  Lifestyle  . Physical activity    Days per week: Not on file    Minutes per session: Not on file  . Stress: Not on file  Relationships  . Social Musicianconnections    Talks on phone: Not on file    Gets together: Not on file    Attends religious service: Not on file    Active member of club or organization: Not on file    Attends meetings of clubs or organizations: Not on file    Relationship status: Not on  file  . Intimate partner violence    Fear of current or ex partner: Not on file    Emotionally abused: Not on file    Physically abused: Not on file    Forced sexual activity: Not on file  Other Topics Concern  . Not on file  Social History Narrative  . Not on file    Past Medical History, Surgical history, Social history, and Family history were reviewed and updated as appropriate.   Has been living in [place]. Please see review of systems for further details on the patient's review from today.   Objective:   Physical Exam:  There were no vitals taken for this visit.  Physical Exam Neurological:     Mental Status: She is alert and oriented to person, place, and time.     Cranial Nerves: No dysarthria.  Psychiatric:        Attention and Perception: Attention normal.        Mood and Affect: Mood is anxious and depressed.        Speech: Speech normal.        Behavior: Behavior is cooperative.        Thought Content: Thought content normal. Thought content is not paranoid or delusional. Thought content does not include homicidal or suicidal ideation. Thought content does not include homicidal or suicidal plan.        Cognition and Memory: Cognition and memory normal.        Judgment: Judgment normal.     Comments: Insight fair.     Lab Review:     Component Value Date/Time   NA 140 05/09/2011 2137   K 3.5 05/09/2011 2137   CL 103 05/09/2011 2137   CO2  28 05/04/2011 0947   GLUCOSE 86 05/09/2011 2137   BUN 12 05/09/2011 2137   CREATININE 0.80 05/09/2011 2137   CALCIUM 10.2 05/04/2011 0947   GFRNONAA >90 05/04/2011 0947   GFRAA >90 05/04/2011 0947       Component Value Date/Time   WBC 12.3 (H) 05/04/2011 0947   RBC 4.59 05/04/2011 0947   HGB 12.9 05/09/2011 2137   HCT 38.0 05/09/2011 2137   PLT 332 05/04/2011 0947   MCV 88.7 05/04/2011 0947   MCH 28.8 05/04/2011 0947   MCHC 32.4 05/04/2011 0947   RDW 13.5 05/04/2011 0947    Lithium Lvl  Date Value Ref Range Status  07/11/2018 0.6 0.6 - 1.2 mmol/L Final    Comment:                                     Detection Limit = 0.1                           <0.1 indicates None Detected      No results found for: PHENYTOIN, PHENOBARB, VALPROATE, CBMZ   .res Assessment: Plan:    Waynetta SandyBeth was seen today for follow-up, add, anxiety and depression.  Diagnoses and all orders for this visit:  Bipolar II disorder (HCC) -     QUEtiapine (SEROQUEL) 100 MG tablet; Take 2 tablets (200 mg total) by mouth at bedtime.  Insomnia due to mental condition -     diazepam (VALIUM) 10 MG tablet; Take 1 tablet (10 mg total) by mouth at bedtime as needed for anxiety or sleep.  Attention deficit hyperactivity disorder (ADHD), combined type  Social anxiety disorder  Adjustment disorder with anxious mood   Supportive therapy.  Dealing with new dx ALS.  Disc Dr. Olene Floss article on lithium  And it's neuroprotective effective even with ALS.  Extensive discussion.  Cont current meds.  Increase quetiapine to 100-200 mg HS.  If that fails trial diazepam 10-20 mg Hs for severe insomnia.  Continue other meds.  Care re Caffeine, sleep hygiene,   45 min appt.    FU 6-8 weeks  Lynder Parents, MD, DFAPA  Please see After Visit Summary for patient specific instructions.  Future Appointments  Date Time Provider [place]  01/12/2019  1:15 PM Cottle, Billey Co., MD CP-CP None    No orders of  the defined types were placed in this encounter.     -------------------------------

## 2018-10-31 ENCOUNTER — Other Ambulatory Visit: Payer: Self-pay | Admitting: Psychiatry

## 2018-11-01 ENCOUNTER — Other Ambulatory Visit: Payer: Self-pay

## 2018-11-01 MED ORDER — LAMOTRIGINE 200 MG PO TABS: 200.0000 mg | ORAL_TABLET | Freq: Every day | ORAL | 1 refills | Status: DC

## 2018-11-01 MED ORDER — LITHIUM CARBONATE ER 450 MG PO TBCR: EXTENDED_RELEASE_TABLET | ORAL | 1 refills | Status: DC

## 2018-12-19 ENCOUNTER — Other Ambulatory Visit: Payer: Self-pay | Admitting: Psychiatry

## 2018-12-20 NOTE — Telephone Encounter (Signed)
Is she on 100 mg in addition to 25 mg prn?

## 2018-12-28 ENCOUNTER — Ambulatory Visit: Payer: 59 | Admitting: Psychiatry

## 2019-01-12 ENCOUNTER — Ambulatory Visit: Payer: 59 | Admitting: Psychiatry

## 2019-01-12 ENCOUNTER — Encounter

## 2019-01-16 ENCOUNTER — Telehealth: Payer: Self-pay | Admitting: Psychiatry

## 2019-01-16 NOTE — Telephone Encounter (Signed)
Fax # [facility]  [phone]

## 2019-01-16 NOTE — Telephone Encounter (Signed)
Pt called again about [facility] order. I advised her to get [place] the fax number to the lab she will use in [place]. and we can fax the lab order there too.

## 2019-01-16 NOTE — Telephone Encounter (Signed)
Patient left vm 01/13/2019 @8 :30 pm would like Lithium Levels checked before appt., on 09/28 if the request can be sent to [facility] in [facility]

## 2019-01-17 ENCOUNTER — Other Ambulatory Visit: Payer: Self-pay | Admitting: Psychiatry

## 2019-01-17 DIAGNOSIS — Z79899 Other long term (current) drug therapy: Secondary | ICD-10-CM

## 2019-01-17 DIAGNOSIS — F3181 Bipolar II disorder: Secondary | ICD-10-CM

## 2019-01-17 NOTE — Telephone Encounter (Signed)
I put in the order.  Please fax to [facility]

## 2019-01-18 ENCOUNTER — Telehealth: Payer: Self-pay | Admitting: Psychiatry

## 2019-01-18 NOTE — Telephone Encounter (Signed)
[place] orders to [phone]

## 2019-01-23 ENCOUNTER — Ambulatory Visit (INDEPENDENT_AMBULATORY_CARE_PROVIDER_SITE_OTHER): Payer: 59 | Admitting: Psychiatry

## 2019-01-23 ENCOUNTER — Encounter: Payer: Self-pay | Admitting: Psychiatry

## 2019-01-23 ENCOUNTER — Other Ambulatory Visit: Payer: Self-pay

## 2019-01-23 DIAGNOSIS — F902 Attention-deficit hyperactivity disorder, combined type: Secondary | ICD-10-CM

## 2019-01-23 DIAGNOSIS — F5105 Insomnia due to other mental disorder: Secondary | ICD-10-CM | POA: Diagnosis not present

## 2019-01-23 DIAGNOSIS — F4322 Adjustment disorder with anxiety: Secondary | ICD-10-CM

## 2019-01-23 DIAGNOSIS — F3181 Bipolar II disorder: Secondary | ICD-10-CM | POA: Diagnosis not present

## 2019-01-23 DIAGNOSIS — F4010 Social phobia, unspecified: Secondary | ICD-10-CM

## 2019-01-23 DIAGNOSIS — Z79899 Other long term (current) drug therapy: Secondary | ICD-10-CM

## 2019-01-23 MED ORDER — DIAZEPAM 10 MG PO TABS: 10.0000 mg | ORAL_TABLET | Freq: Every evening | ORAL | 1 refills | Status: DC | PRN

## 2019-01-23 NOTE — Progress Notes (Signed)
Cynthia Hogan [id number] 12-17-1965 53 y.o.    Subjective:   Patient ID:  Cynthia Hogan is a 53 y.o. (DOB 13-Jan-1966) female.  Chief Complaint:  Chief Complaint  Patient presents with  . Fatigue  . Stress    Dx ALS and long QT syndrome  . Mood follow up    HPI Cynthia Hogan presents for follow-up of bipolar disorder with additional stressors of severe medical diagnosis..  Last seen June 20, 2018.  No meds were changed.  Lithium level was ordered. March 6 was 0.6.  Shocking news re DX ALS. Knee surgery 4 week ago.  Has a [employer] down there in [place] and is in process..  Hard to get off pain meds after 2 weeks.  Caused some depression.  Right now in 1st stage of grief.  Can't believe it.  Haven't accepted it yet.  Plans to get a counselor.    Been to [facility] and has done a lot of research.  Has a list of concerns re: meds and mental health.  Questions re: meds and supplements.    Never took more than 100 mg Seroquel DT hangover.  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 Stopped Prozac a month ago and noticed more tearfulness.  Hard time sleeping.  Initial and terminal insomnia.  Also using orange glasses..  Will sleep sometimes  Later but only averaging 4 hours.  Some nights racing thought when can't go to sleep.  Tired.  Not usually a lot of anxiety about sleep but the last week she is dreading going to sleep. No late caffeine.  1 diet coke in morning.  Patient reports mood is shocked over diagnosis.  Difficult to evaluate the effect on mood in changing her medications.  Patient denies any recent difficulty with anxiety.  Patient denies difficulty with sleep initiation or maintenance.  This is an ongoing concern she would like help with it.  Denies appetite disturbance.  Patient reports that energy and motivation have been good.  Patient denies any difficulty with  concentration.  Patient denies any suicidal ideation.  Past Psychiatric Medication Trials:  , Wellbutrin, citalopram, Zoloft, fluoxetine mid 2014, Buspirone,  Seroquel 50 , doxepin,  Belsomra, Vistaril,  trazodone NR, Ativan no sleepiness  lithium, lamotrigine, Wellbutrin, Geodon, Abilify, Cytomel Vyvanse, Concerta, Focalin,  NAC, Ritalin, Metadate,  Hx suicide attempt Xanax  Review of Systems:  Review of Systems  Neurological: Positive for weakness. Negative for tremors.    Medications: I have reviewed the patient's current medications.  Current Outpatient Medications  Medication Sig Dispense Refill  . cetirizine (ZYRTEC) 10 MG tablet Take 10 mg by mouth daily.      . irbesartan (AVAPRO) 150 MG tablet     . LORazepam (ATIVAN) 0.5 MG tablet TAKE 1 TO 2 TABLETS BY MOUTH TWICE A DAY AS NEEDED FOR ANXIETY 100 tablet 0  . Multiple Vitamins-Minerals (MULTIVITAMINS THER. W/MINERALS) TABS Take 2 tablets by mouth daily. Chewable.     [name] Bicarbonate (ZEGERID OTC PO) Take 1 tablet by mouth at bedtime.      . riluzole (RILUTEK) 50 MG tablet Take 50 mg by mouth 2 (two) times daily.    . diazepam (VALIUM) 10 MG tablet Take 1-2 tablets (10-20 mg total) by mouth at bedtime as needed for anxiety or sleep. 60 tablet 1  . FLUoxetine (PROZAC) 20 MG capsule Take 1 capsule (20 mg  total) by mouth daily. (Patient not taking: Reported on 01/23/2019) 90 capsule 3  . lamoTRIgine (LAMICTAL) 200 MG tablet Take 1 tablet (200 mg total) by mouth daily. (Patient not taking: Reported on 01/23/2019) 90 tablet 1  . lisdexamfetamine (VYVANSE) 70 MG capsule Take 1 capsule (70 mg total) by mouth every morning. (Patient not taking: Reported on 01/23/2019) 30 capsule 0  . lisdexamfetamine (VYVANSE) 70 MG capsule Take 1 capsule (70 mg total) by mouth daily. (Patient not taking: Reported on 01/23/2019) 30 capsule 0  . lisdexamfetamine (VYVANSE) 70 MG capsule Take 1 capsule (70 mg total) by mouth daily. (Patient not  taking: Reported on 01/23/2019) 30 capsule 0  . lithium carbonate (ESKALITH) 450 MG CR tablet TAKE ONE-HALF TABLET BY  MOUTH EVERY MORNING AND 2  TABLETS AT BEDTIME (Patient taking differently: TAKE ONE-HALF TABLET BY  MOUTH EVERY MORNING AND 1 1/2  TABLETS AT BEDTIME) 225 tablet 1  . propranolol (INDERAL) 20 MG tablet TAKE 1 TABLET BY MOUTH TWO  TIMES DAILY (Patient not taking: Reported on 01/23/2019) 180 tablet 1  . QUEtiapine (SEROQUEL) 100 MG tablet Take 2 tablets (200 mg total) by mouth at bedtime. (Patient not taking: Reported on 01/23/2019) 60 tablet 3  . QUEtiapine (SEROQUEL) 25 MG tablet TAKE 1 TABLET BY MOUTH AT  BEDTIME AS NEEDED (Patient not taking: Reported on 01/23/2019) 90 tablet 3   No current facility-administered medications for this visit.     Medication Side Effects: None  Allergies:  Allergies  Allergen Reactions  . Other     Anesthesia med makes pt very ill. Pt not sure which medication it was.  . Tetracycline     Past Medical History:  Diagnosis Date  . Anxiety   . Depression   . GERD (gastroesophageal reflux disease)   . Headache(784.0)    has daily, migranes in past  . Hypertension   . Mental disorder    Bipolar  . PONV (postoperative nausea and vomiting)   . Seasonal allergies   . Suicide (HCC)    attempt about 7 yrs ago  . Thyroid mass     History reviewed. No pertinent family history.  Social History   Socioeconomic History  . Marital status: Married    Spouse name: Not on file  . Number of children: Not on file  . Years of education: Not on file  . Highest education level: Not on file  Occupational History  . Not on file  Social Needs  . Financial resource strain: Not on file  . Food insecurity    Worry: Not on file    Inability: Not on file  . Transportation needs    Medical: Not on file    Non-medical: Not on file  Tobacco Use  . Smoking status: Never Smoker  . Smokeless tobacco: Never Used  Substance and Sexual Activity  .  Alcohol use: Yes    Comment: occasionally  . Drug use: Not on file  . Sexual activity: Yes    Birth control/protection: I.U.D.  Lifestyle  . Physical activity    Days per week: Not on file    Minutes per session: Not on file  . Stress: Not on file  Relationships  . Social Musician on phone: Not on file    Gets together: Not on file    Attends religious service: Not on file    Active member of club or organization: Not on file    Attends meetings of clubs or  organizations: Not on file    Relationship status: Not on file  . Intimate partner violence    Fear of current or ex partner: Not on file    Emotionally abused: Not on file    Physically abused: Not on file    Forced sexual activity: Not on file  Other Topics Concern  . Not on file  Social History Narrative  . Not on file    Past Medical History, Surgical history, Social history, and Family history were reviewed and updated as appropriate.   Has been living in [place]. Please see review of systems for further details on the patient's review from today.   Objective:   Physical Exam:  There were no vitals taken for this visit.  Physical Exam Constitutional:      General: She is not in acute distress.    Appearance: She is well-developed.  Musculoskeletal:        General: No deformity.  Neurological:     Mental Status: She is alert and oriented to person, place, and time.     Cranial Nerves: No dysarthria.     Motor: Weakness present.     Coordination: Coordination abnormal.     Gait: Gait abnormal.     Comments: Walks with a cane  Psychiatric:        Attention and Perception: Attention and perception normal. She does not perceive auditory or visual hallucinations.        Mood and Affect: Mood is anxious and depressed. Affect is not labile, blunt, angry or inappropriate.        Speech: Speech normal.        Behavior: Behavior normal. Behavior is cooperative.        Thought Content: Thought  content normal. Thought content is not paranoid or delusional. Thought content does not include homicidal or suicidal ideation. Thought content does not include homicidal or suicidal plan.        Cognition and Memory: Cognition and memory normal.        Judgment: Judgment normal.     Comments: Insight fair. She is obviously preoccupied with her diagnosis of ALS.     Lab Review:     Component Value Date/Time   NA 140 05/09/2011 2137   K 3.5 05/09/2011 2137   CL 103 05/09/2011 2137   CO2 28 05/04/2011 0947   GLUCOSE 86 05/09/2011 2137   BUN 12 05/09/2011 2137   CREATININE 0.80 05/09/2011 2137   CALCIUM 10.2 05/04/2011 0947   GFRNONAA >90 05/04/2011 0947   GFRAA >90 05/04/2011 0947       Component Value Date/Time   WBC 12.3 (H) 05/04/2011 0947   RBC 4.59 05/04/2011 0947   HGB 12.9 05/09/2011 2137   HCT 38.0 05/09/2011 2137   PLT 332 05/04/2011 0947   MCV 88.7 05/04/2011 0947   MCH 28.8 05/04/2011 0947   MCHC 32.4 05/04/2011 0947   RDW 13.5 05/04/2011 0947    Lithium Lvl  Date Value Ref Range Status  07/11/2018 0.6 0.6 - 1.2 mmol/L Final    Comment:                                     Detection Limit = 0.1                           <0.1 indicates None Detected  No results found for: PHENYTOIN, PHENOBARB, VALPROATE, CBMZ   .res Assessment: Plan:    Cynthia Hogan was seen today for fatigue, stress and mood follow up.  Diagnoses and all orders for this visit:  Bipolar II disorder (HCC)  Insomnia due to mental condition -     diazepam (VALIUM) 10 MG tablet; Take 1-2 tablets (10-20 mg total) by mouth at bedtime as needed for anxiety or sleep.  Attention deficit hyperactivity disorder (ADHD), combined type  Social anxiety disorder  Lithium use  Adjustment disorder with anxious mood   Supportive therapy.  Dealing with new dx ALS.  Her genetic testing was negative for ALS.  Disc Dr. Arlie Solomons article on lithium  And it's neuroprotective effective even with ALS.   Extensive discussion.  Cont current meds.  But she is stopped fluoxetine, lamotrigine, and Vyvanse.  She also stopped quetiapine and is not taking propranolol.  Discussed the risk of stopping these multiple medications.  trial diazepam 10-20 mg Hs for severe insomnia.  Disc risk of insomnia having negative effect on her brain health. Discussed the alternative of clonazepam and her history of overdose on Xanax.  She is not currently suicidal.  The diazepam is more likely to work quicker with onset than his clonazepam but still should be helpful at keeping her sleep.  She questions whether other psych meds may have increased risks associated with ALS.  Wonders about the future.    Consider other antidepressants if needed.   Call if more depressed.  We will attempt to eliminate the number of medications at this point.  Disc possible briefly stopping lithium to reevaluate effect on QTc.  There is mood risk off of lithium.  She is taking very little mood medicine at this point compared to what she is taken in the past.  Continue lithium at current dose. Care re Caffeine, sleep hygiene,   Answered questions about supplements fro brain health and probiotic B longum, melatonin, and others. She plans to see a functional doctor at [facility] in hopes of reversing the ALS.  She's aware this is alternative medicine.    Supportive therapy to deal with grief over the dx of ALS and how to address.  Finding healthy  Balance on self research on ALS and also expressing interest in other areas of life.    45 min appt.    FU 6-8 weeks  Cynthia Staggers, MD, DFAPA  Please see After Visit Summary for patient specific instructions.  Future Appointments  Date Time Provider Department Center  03/20/2019 10:30 AM Cottle, Steva Ready., MD [facility] None    No orders of the defined types were placed in this encounter.     -------------------------------

## 2019-01-26 ENCOUNTER — Ambulatory Visit: Payer: 59 | Admitting: Psychiatry

## 2019-02-04 ENCOUNTER — Other Ambulatory Visit: Payer: Self-pay | Admitting: Psychiatry

## 2019-02-04 DIAGNOSIS — F3181 Bipolar II disorder: Secondary | ICD-10-CM

## 2019-02-16 ENCOUNTER — Telehealth: Payer: Self-pay | Admitting: Psychiatry

## 2019-02-16 NOTE — Telephone Encounter (Signed)
Patient had to go off of the Prozac, Dr. Clovis Pu said to give it a little more time, in doing so, she would like to go back on the Prozac but wants to know if there is something else better she can take.

## 2019-02-16 NOTE — Telephone Encounter (Signed)
An excellent option with very good response and low SE with some risk of nausea but otherwise limited SE risk is Trintellix 10 mg 1  Daily.  Does she want to try something different than Prozac?  If so this is the best option.  She's recently diagnosed with ALS.

## 2019-02-17 ENCOUNTER — Other Ambulatory Visit: Payer: Self-pay

## 2019-02-17 MED ORDER — VORTIOXETINE HBR 10 MG PO TABS: 10.0000 mg | ORAL_TABLET | Freq: Every day | ORAL | 1 refills | Status: DC

## 2019-02-17 NOTE — Telephone Encounter (Signed)
Left voice mail to call back 

## 2019-02-23 ENCOUNTER — Other Ambulatory Visit: Payer: Self-pay | Admitting: Psychiatry

## 2019-03-01 ENCOUNTER — Other Ambulatory Visit: Payer: Self-pay | Admitting: Psychiatry

## 2019-03-20 ENCOUNTER — Other Ambulatory Visit: Payer: Self-pay

## 2019-03-20 ENCOUNTER — Ambulatory Visit (INDEPENDENT_AMBULATORY_CARE_PROVIDER_SITE_OTHER): Payer: 59 | Admitting: Psychiatry

## 2019-03-20 ENCOUNTER — Encounter: Payer: Self-pay | Admitting: Psychiatry

## 2019-03-20 ENCOUNTER — Ambulatory Visit: Payer: 59 | Admitting: Psychiatry

## 2019-03-20 DIAGNOSIS — F902 Attention-deficit hyperactivity disorder, combined type: Secondary | ICD-10-CM

## 2019-03-20 DIAGNOSIS — F3181 Bipolar II disorder: Secondary | ICD-10-CM

## 2019-03-20 DIAGNOSIS — Z79899 Other long term (current) drug therapy: Secondary | ICD-10-CM | POA: Diagnosis not present

## 2019-03-20 DIAGNOSIS — F4010 Social phobia, unspecified: Secondary | ICD-10-CM | POA: Diagnosis not present

## 2019-03-20 DIAGNOSIS — F5105 Insomnia due to other mental disorder: Secondary | ICD-10-CM | POA: Diagnosis not present

## 2019-03-20 DIAGNOSIS — F4322 Adjustment disorder with anxiety: Secondary | ICD-10-CM

## 2019-03-20 MED ORDER — DAYVIGO 5 MG PO TABS: 5.0000 mg | ORAL_TABLET | Freq: Every evening | ORAL | 1 refills | Status: DC

## 2019-03-20 MED ORDER — VORTIOXETINE HBR 10 MG PO TABS: 10.0000 mg | ORAL_TABLET | Freq: Every day | ORAL | 1 refills | Status: DC

## 2019-03-20 NOTE — Progress Notes (Signed)
Cynthia Hogan [id number] Feb 22, 1966 53 y.o.  Virtual Visit via Telephone Note  I connected with pt by telephone and verified that I am speaking with the correct person using two identifiers.   I discussed the limitations, risks, security and privacy concerns of performing an evaluation and management service by telephone and the availability of in person appointments. I also discussed with the patient that there may be a patient responsible charge related to this service. The patient expressed understanding and agreed to proceed.  I discussed the assessment and treatment plan with the patient. The patient was provided an opportunity to ask questions and all were answered. The patient agreed with the plan and demonstrated an understanding of the instructions.   The patient was advised to call back or seek an in-person evaluation if the symptoms worsen or if the condition fails to improve as anticipated.  I provided 30 minutes of non-face-to-face time during this encounter. The call started at 11:00 and ended at 1130. The patient was located at home and the provider was located office.   Subjective:   Patient ID:  Cynthia Hogan is a 53 y.o. (DOB May 20, 1965) female.  Chief Complaint:  Chief Complaint  Patient presents with  . Follow-up    Medication Management  . Other    Bipolar 2  . Medication Refill    Dayvigo and Trintellex  . Depression    Medication Refill Associated symptoms include fatigue and weakness.   [name] presents for follow-up of bipolar disorder with additional stressors of severe medical diagnosis..  Last seen June 20, 2018.  No meds were changed.  Lithium level was ordered. March 6 was 0.6.  Shocking news re DX ALS. Knee surgery 3 months ago.  Has a [name] down there in [place] and is in process..  Hard to get off pain meds after 2 weeks.  Caused some depression.   Been to [facility] and has done a lot of research.    Patient  last seen in September 2020.  She had essentially stopped all her psych meds out of concern that the possibly could worsen ALS.  However she was not sleeping and was agreeable to trying to address that and was given prescription for diazepam.  Currently she is back to taking lithium CR 450 mg tablets one half in the morning and 1/2 tablets at night, lorazepam 0.5 mg twice daily as needed anxiety, propranolol 20 mg twice a day, Trintellix 10 mg daily, and Dayvigo for about a week.  Dayvigo seems to help her get back to sleep.  But awaken several times in parts with bladder issues.  Diazepam 15 mg hangover.  Started Trintellix at the last visit.  Did not do well off the antidepressants Prozac.  Crying and angry out of control but this really helped within a couple of weeks.  These sx have resolved to a normal degree.  Able to enjoy things and has good interests.  No desire to change or increase.  No late caffeine.  1 diet coke in morning.  Patient reports mood is shocked over diagnosis.  Difficult to evaluate the effect on mood in changing her medications.  Patient denies any recent difficulty with anxiety.  Patient denies difficulty with sleep initiation or maintenance.  This is an ongoing concern she would like help with it.  Denies appetite disturbance.  Patient reports that energy and motivation have been good.  Patient denies any difficulty with concentration.  Patient denies any suicidal  ideation.  Past Psychiatric Medication Trials:  , Wellbutrin, citalopram, Zoloft, fluoxetine mid 2014, Buspirone,  Seroquel 50 , doxepin,  Belsomra, Vistaril,  trazodone NR, Ativan no sleepiness Never took more than 100 mg Seroquel DT hangover. lithium, lamotrigine, Wellbutrin, Geodon, Abilify, Cytomel Vyvanse, Concerta, Focalin,  NAC, Ritalin, Metadate,  Hx suicide attempt Xanax  Review of Systems:  Review of Systems  Constitutional: Positive for fatigue.  HENT: Positive for voice change.    Neurological: Positive for weakness. Negative for tremors.    Medications: I have reviewed the patient's current medications.  Current Outpatient Medications  Medication Sig Dispense Refill  . cetirizine (ZYRTEC) 10 MG tablet Take 10 mg by mouth daily.      . irbesartan (AVAPRO) 150 MG tablet     . Lemborexant (DAYVIGO) 5 MG TABS Take 5 mg by mouth.    . lithium carbonate (ESKALITH) 450 MG CR tablet TAKE ONE-HALF TABLET BY  MOUTH EVERY MORNING AND 2  TABLETS AT BEDTIME (Patient taking differently: TAKE ONE TABLET BY  MOUTH EVERY MORNING AND 1 1/2  TABLETS AT BEDTIME) 225 tablet 1  . LORazepam (ATIVAN) 0.5 MG tablet TAKE 1 TO 2 TABLETS BY MOUTH TWICE A DAY AS NEEDED FOR ANXIETY 100 tablet 0  . Multiple Vitamins-Minerals (MULTIVITAMINS THER. W/MINERALS) TABS Take 2 tablets by mouth daily. Chewable.     [name] MYRBETRIQ 25 MG TB24 tablet Take 50 mg by mouth daily.     [name] Bicarbonate (ZEGERID OTC PO) Take 1 tablet by mouth at bedtime.      . ondansetron (ZOFRAN) 4 MG tablet     . propranolol (INDERAL) 20 MG tablet TAKE 1 TABLET BY MOUTH  TWICE DAILY 180 tablet 3  . riluzole (RILUTEK) 50 MG tablet Take 50 mg by mouth 2 (two) times daily.    [name] vortioxetine HBr (TRINTELLIX) 10 MG TABS tablet Take 1 tablet (10 mg total) by mouth daily. 30 tablet 1  . diazepam (VALIUM) 10 MG tablet Take 1-2 tablets (10-20 mg total) by mouth at bedtime as needed for anxiety or sleep. (Patient not taking: Reported on 03/20/2019) 60 tablet 1   No current facility-administered medications for this visit.     Medication Side Effects: None  Allergies:  Allergies  Allergen Reactions  . Other     Anesthesia med makes pt very ill. Pt not sure which medication it was.  . Tetracycline     Past Medical History:  Diagnosis Date  . Anxiety   . Depression   . GERD (gastroesophageal reflux disease)   . Headache(784.0)    has daily, migranes in past  . Hypertension   . Mental disorder    Bipolar  .  PONV (postoperative nausea and vomiting)   . Seasonal allergies   . Suicide ([place])    attempt about 7 yrs ago  . Thyroid mass     History reviewed. No pertinent family history.  Social History   Socioeconomic History  . Marital status: Married    Spouse name: Not on file  . Number of children: Not on file  . Years of education: Not on file  . Highest education level: Not on file  Occupational History  . Not on file  Social Needs  . Financial resource strain: Not on file  . Food insecurity    Worry: Not on file    Inability: Not on file  . Transportation needs    Medical: Not on file    Non-medical: Not on file  Tobacco Use  . Smoking status: Never Smoker  . Smokeless tobacco: Never Used  Substance and Sexual Activity  . Alcohol use: Yes    Comment: occasionally  . Drug use: Not on file  . Sexual activity: Yes    Birth control/protection: I.U.D.  Lifestyle  . Physical activity    Days per week: Not on file    Minutes per session: Not on file  . Stress: Not on file  Relationships  . Social Musicianconnections    Talks on phone: Not on file    Gets together: Not on file    Attends religious service: Not on file    Active member of club or organization: Not on file    Attends meetings of clubs or organizations: Not on file    Relationship status: Not on file  . Intimate partner violence    Fear of current or ex partner: Not on file    Emotionally abused: Not on file    Physically abused: Not on file    Forced sexual activity: Not on file  Other Topics Concern  . Not on file  Social History Narrative  . Not on file    Past Medical History, Surgical history, Social history, and Family history were reviewed and updated as appropriate.   Has been living in [place]. Please see review of systems for further details on the patient's review from today.   Objective:   Physical Exam:  There were no vitals taken for this visit.  Physical Exam Neurological:      Mental Status: She is alert and oriented to person, place, and time.     Cranial Nerves: Dysarthria present.     Comments: Voice is weaker   Psychiatric:        Attention and Perception: Attention normal.        Mood and Affect: Mood is anxious and depressed.        Speech: Speech normal.        Behavior: Behavior is cooperative.        Thought Content: Thought content normal. Thought content is not paranoid or delusional. Thought content does not include homicidal or suicidal ideation. Thought content does not include homicidal or suicidal plan.        Cognition and Memory: Cognition and memory normal.        Judgment: Judgment normal.     Lab Review:     Component Value Date/Time   NA 140 05/09/2011 2137   K 3.5 05/09/2011 2137   CL 103 05/09/2011 2137   CO2 28 05/04/2011 0947   GLUCOSE 86 05/09/2011 2137   BUN 12 05/09/2011 2137   CREATININE 0.80 05/09/2011 2137   CALCIUM 10.2 05/04/2011 0947   GFRNONAA >90 05/04/2011 0947   GFRAA >90 05/04/2011 0947       Component Value Date/Time   WBC 12.3 (H) 05/04/2011 0947   RBC 4.59 05/04/2011 0947   HGB 12.9 05/09/2011 2137   HCT 38.0 05/09/2011 2137   PLT 332 05/04/2011 0947   MCV 88.7 05/04/2011 0947   MCH 28.8 05/04/2011 0947   MCHC 32.4 05/04/2011 0947   RDW 13.5 05/04/2011 0947    Lithium Lvl  Date Value Ref Range Status  07/11/2018 0.6 0.6 - 1.2 mmol/L Final    Comment:  Detection Limit = 0.1                           <0.1 indicates None Detected      No results found for: PHENYTOIN, PHENOBARB, VALPROATE, CBMZ   Lithium level on current dosage September 0.4 TSH 0.790 Checked BMP  .res Assessment: Plan:    L[place] was seen today for follow-up, other, medication refill and depression.  Diagnoses and all orders for this visit:  Bipolar II disorder (HCC)  Insomnia due to mental condition  Social anxiety disorder  Lithium use  Adjustment disorder with anxious  mood  Attention deficit hyperactivity disorder (ADHD), combined type   Supportive therapy.  Dealing with new dx ALS.  Her genetic testing was negative for ALS.  Disc Dr. Arlie Solomons article on lithium  And it's neuroprotective effective even with ALS.  Extensive discussion.  Cont current meds.  But she is stopped fluoxetine, lamotrigine, and Vyvanse.  She also stopped quetiapine and is not taking propranolol.  Discussed the risk of stopping these multiple medications.  Sleep so far is improved with Dayvigo 5 mg.  She is aware of the option of increasing to 10 mg if needed.  She had side effects with diazepam and lack of effect at 10 mg.  She questions whether other psych meds may have increased risks associated with ALS.  Wonders about the future.    Better on the Trintellix 10 mg daily which she started October 22.  Hard to compare to fluoxetine bc of situation.  Disc possible briefly stopping lithium to reevaluate effect on QTc.  There is mood risk off of lithium.  She is taking very little mood medicine at this point compared to what she is taken in the past.  Continue lithium at current dose.  Discussed the blood level and labs which appears stable.  It is a little unusual that she has such a low lithium level based on the current dosage but that is possible. Care re Caffeine, sleep hygiene,   Answered questions about supplements fro brain health and probiotic B longum, melatonin, and others.  Supportive therapy to deal with grief over the dx of ALS and how to address.  Finding healthy  Balance on self research on ALS and also expressing interest in other areas of life. Having to face decisions about her future health she hasn't decided to face yet.  She is more accepting of the diagnosis and the complications that she faces as of this visit than last visit.  30 min appt.    FU 2-3 mos  Meredith Staggers, MD, DFAPA  Please see After Visit Summary for patient specific instructions.  No future  appointments.  No orders of the defined types were placed in this encounter.     -------------------------------

## 2019-03-22 ENCOUNTER — Telehealth: Payer: Self-pay

## 2019-03-22 NOTE — Telephone Encounter (Signed)
Prior authorization submitted and approved for Dayvigo 5 mg tablets effective 03/21/2019-03/20/2020 [facility]#[id number] Through cover my meds

## 2019-03-30 ENCOUNTER — Telehealth: Payer: Self-pay | Admitting: Psychiatry

## 2019-03-30 NOTE — Telephone Encounter (Signed)
Called and spoke with [facility], it was shipped on 03/27/2019 and delivered today at 12:30 pm. Left patient voicemail and if for some reason she didn't received let us know.

## 2019-03-30 NOTE — Telephone Encounter (Signed)
Pt called stating have not received Rx for Dayvigo.. Thought Rx was sent 11/23 after telehealth appt with CC. Did get other refills. Please advise. In system parmacy for mail order is [facility]. OK to send to local pharmacy if need be for now.

## 2019-04-12 ENCOUNTER — Telehealth: Payer: Self-pay | Admitting: Psychiatry

## 2019-04-12 NOTE — Telephone Encounter (Signed)
Okay to increase Dayvigo to 10 mg q. at bedtime.  She can double up on the fives until she needs a new prescription and we can send in a 10 mg size for her.

## 2019-04-12 NOTE — Telephone Encounter (Signed)
Cynthia Hogan called to ask if she could increase her dose of Dayvigo.  She is at 5mg  now and you indicated there was room to move up.  If you agree she can increase, how much? Please call to discuss.

## 2019-04-13 ENCOUNTER — Other Ambulatory Visit: Payer: Self-pay | Admitting: Psychiatry

## 2019-04-13 DIAGNOSIS — F4322 Adjustment disorder with anxiety: Secondary | ICD-10-CM

## 2019-04-13 DIAGNOSIS — F4010 Social phobia, unspecified: Secondary | ICD-10-CM

## 2019-04-13 DIAGNOSIS — F3181 Bipolar II disorder: Secondary | ICD-10-CM

## 2019-04-13 NOTE — Telephone Encounter (Signed)
Ask about pharmacy

## 2019-04-13 NOTE — Telephone Encounter (Signed)
Ask patient if using mail order for Trintellix, received refill request from pharmacy at [facility]

## 2019-04-13 NOTE — Telephone Encounter (Signed)
Left voice mail to call back 

## 2019-04-14 NOTE — Telephone Encounter (Signed)
Patient called back and given instructions to increase Dayvigo to 10 mg at night. She has plenty of 5 mg. Will call back when needs updated Rx for 10 mg.  Also does not need Rx for Trintellix sent to HT at [facility].

## 2019-05-30 ENCOUNTER — Encounter: Payer: Self-pay | Admitting: Psychiatry

## 2019-05-30 ENCOUNTER — Ambulatory Visit (INDEPENDENT_AMBULATORY_CARE_PROVIDER_SITE_OTHER): Payer: 59 | Admitting: Psychiatry

## 2019-05-30 DIAGNOSIS — F5105 Insomnia due to other mental disorder: Secondary | ICD-10-CM | POA: Diagnosis not present

## 2019-05-30 DIAGNOSIS — F4010 Social phobia, unspecified: Secondary | ICD-10-CM | POA: Diagnosis not present

## 2019-05-30 DIAGNOSIS — F902 Attention-deficit hyperactivity disorder, combined type: Secondary | ICD-10-CM

## 2019-05-30 DIAGNOSIS — F4322 Adjustment disorder with anxiety: Secondary | ICD-10-CM

## 2019-05-30 DIAGNOSIS — Z79899 Other long term (current) drug therapy: Secondary | ICD-10-CM | POA: Diagnosis not present

## 2019-05-30 DIAGNOSIS — F3181 Bipolar II disorder: Secondary | ICD-10-CM | POA: Diagnosis not present

## 2019-05-30 MED ORDER — LORAZEPAM 0.5 MG PO TABS: ORAL_TABLET | ORAL | 3 refills | Status: DC

## 2019-05-30 NOTE — Progress Notes (Signed)
Cynthia Hogan [id number] [birth year] 54 y.o.  Virtual Visit via WebEX  I connected with pt by WebEx and verified that I am speaking with the correct person using two identifiers.   I discussed the limitations, risks, security and privacy concerns of performing an evaluation and management service by [place] [place] and the availability of in person appointments. I also discussed with the patient that there may be a patient responsible charge related to this service. The patient expressed understanding and agreed to proceed.  I discussed the assessment and treatment plan with the patient. The patient was provided an opportunity to ask questions and all were answered. The patient agreed with the plan and demonstrated an understanding of the instructions.   The patient was advised to call back or seek an in-person evaluation if the symptoms worsen or if the condition fails to improve as anticipated.  I provided 30 minutes of video time during this encounter. The call started at 100 and ended at 1:30. The patient was located at home and the provider was located office.   Subjective:   Patient ID:  Cynthia Hogan is a 54 y.o. (DOB 26-Feb-1966) female.  Chief Complaint:  Chief Complaint  Patient presents with  . Depression  . Anxiety  . Sleeping Problem  . Stress    dx ALS    HPI Cynthia Hogan presents for follow-up of bipolar disorder with additional stressors of severe medical diagnosis..  Last seen June 20, 2018.  No meds were changed.  Lithium level was ordered. March 6 was 0.6.  2020 DX ALS.  Followed at the [facility]. [name]   Been to [facility] and has done a lot of research.  Has a list of concerns re: meds and mental health.  Questions re: meds and supplements.    Last seen September 2020.  She had stopped virtually all of her psych meds.  We did not start any new psych meds. Tried Dayvigo or diazepam.  Good holidays.  Doing OK overall but a lot on her plate.  PT and OT   Sensitive to sleepers with fine line of dosing.  Both Dayvigo and diazepam sensitive to it.  Hard time sleeping ongoing.  Initial and terminal insomnia.  Also using orange glasses..  Will sleep sometimes  Later but only averaging 4 hours. Delayed sleep syndrome.  Adequate hours of sleep.   Some nights racing thought when can't go to sleep.  Tired.  Not usually a lot of anxiety about sleep but the last week she is dreading going to sleep. Taking melatonin.  No late caffeine.  1 diet coke in morning.  Overwhelmed waiting for tentative drug trial.  Up in the air.  Patient reports mood is better with Trintellix. Started Trintellix 04 February 2019.  Helped faster than I could have hoped.   Patient denies any recent difficulty with anxiety.    Denies appetite disturbance.  Patient reports that energy and motivation have been good.  Patient denies any difficulty with concentration.  Patient denies any suicidal ideation.  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 Stopped Prozac about August 2020 and noticed more tearfulness. Stopped lithium briefly and felt worse off of it.  Past Psychiatric Medication Trials:  , Wellbutrin, citalopram, Zoloft, fluoxetine mid 2014, Trintellix,, Wellbutrin, Buspirone,  Seroquel 50 , doxepin,  Belsomra, Vistaril,  trazodone NR, Ativan no sleepiness, diazepam sedation Tried Dayvigo & diazepam. lithium,  lamotrigine Geodon, Abilify, Cytomel Vyvanse, Concerta, Focalin,  NAC, Ritalin, Metadate,  Hx suicide attempt Xanax 100 mg Seroquel DT hangover.  Review of Systems:  Review of Systems  Constitutional: Positive for fatigue.  Gastrointestinal: Positive for nausea.  Musculoskeletal: Positive for gait problem.  Skin:       itching  Neurological: Positive for weakness. Negative for tremors.    Medications: I have reviewed the patient's current medications.  Current Outpatient  Medications  Medication Sig Dispense Refill  . cetirizine (ZYRTEC) 10 MG tablet Take 10 mg by mouth daily.      . irbesartan (AVAPRO) 150 MG tablet     . lithium carbonate (ESKALITH) 450 MG CR tablet TAKE ONE-HALF TABLET BY  MOUTH EVERY MORNING AND 2  TABLETS AT BEDTIME (Patient taking differently: TAKE ONE TABLET BY  MOUTH EVERY MORNING AND 1 1/2  TABLETS AT BEDTIME) 225 tablet 1  . Multiple Vitamins-Minerals (MULTIVITAMINS THER. W/MINERALS) TABS Take 2 tablets by mouth daily. Chewable.     [name] MYRBETRIQ 25 MG TB24 tablet Take 50 mg by mouth daily.     [name] Bicarbonate (ZEGERID OTC PO) Take 1 tablet by mouth at bedtime.      . ondansetron (ZOFRAN) 4 MG tablet     . riluzole (RILUTEK) 50 MG tablet Take 50 mg by mouth 2 (two) times daily.    [name] vortioxetine HBr (TRINTELLIX) 10 MG TABS tablet Take 1 tablet (10 mg total) by mouth daily. 90 tablet 1  . Lemborexant (DAYVIGO) 5 MG TABS Take 5 mg by mouth Nightly. (Patient not taking: Reported on 05/30/2019) 90 tablet 1  . LORazepam (ATIVAN) 0.5 MG tablet TAKE 1 TO 2 TABLETS BY MOUTH TWICE A DAY AS NEEDED FOR ANXIETY 100 tablet 3  . propranolol (INDERAL) 20 MG tablet TAKE 1 TABLET BY MOUTH  TWICE DAILY (Patient not taking: Reported on 05/30/2019) 180 tablet 3   No current facility-administered medications for this visit.    Medication Side Effects: None  Allergies:  Allergies  Allergen Reactions  . Other     Anesthesia med makes pt very ill. Pt not sure which medication it was.  . Tetracycline     Past Medical History:  Diagnosis Date  . Anxiety   . Depression   . GERD (gastroesophageal reflux disease)   . Headache(784.0)    has daily, migranes in past  . Hypertension   . Mental disorder    Bipolar  . PONV (postoperative nausea and vomiting)   . Seasonal allergies   . Suicide (HCC)    attempt about 7 yrs ago  . Thyroid mass     History reviewed. No pertinent family history.  Social History   Socioeconomic History  .  Marital status: Married    Spouse name: Not on file  . Number of children: Not on file  . Years of education: Not on file  . Highest education level: Not on file  Occupational History  . Not on file  Tobacco Use  . Smoking status: Never Smoker  . Smokeless tobacco: Never Used  Substance and Sexual Activity  . Alcohol use: Yes    Comment: occasionally  . Drug use: Not on file  . Sexual activity: Yes    Birth control/protection: I.U.D.  Other Topics Concern  . Not on file  Social History Narrative  . Not on file   Social Determinants of Health   Financial Resource Strain:   . Difficulty of Paying Living Expenses: Not on file  Food Insecurity:   . Worried About Charity fundraiser in the Last Year: Not on file  . Ran Out of Food in the Last Year: Not on file  Transportation Needs:   . Lack of Transportation (Medical): Not on file  . Lack of Transportation (Non-Medical): Not on file  Physical Activity:   . Days of Exercise per Week: Not on file  . Minutes of Exercise per Session: Not on file  Stress:   . Feeling of Stress : Not on file  Social Connections:   . Frequency of Communication with Friends and Family: Not on file  . Frequency of Social Gatherings with Friends and Family: Not on file  . Attends Religious Services: Not on file  . Active Member of Clubs or Organizations: Not on file  . Attends Archivist Meetings: Not on file  . Marital Status: Not on file  Intimate Partner Violence:   . Fear of Current or Ex-Partner: Not on file  . Emotionally Abused: Not on file  . Physically Abused: Not on file  . Sexually Abused: Not on file    Past Medical History, Surgical history, Social history, and Family history were reviewed and updated as appropriate.   Has been living in [place]. Please see review of systems for further details on the patient's review from today.   Objective:   Physical Exam:  There were no vitals taken for this  visit.  Physical Exam Neurological:     Mental Status: She is alert and oriented to person, place, and time.     Cranial Nerves: No dysarthria.  Psychiatric:        Attention and Perception: Attention and perception normal.        Mood and Affect: Mood is anxious. Mood is not depressed.        Speech: Speech is slurred.        Behavior: Behavior is slowed. Behavior is cooperative.        Thought Content: Thought content normal. Thought content is not paranoid or delusional. Thought content does not include homicidal or suicidal ideation. Thought content does not include homicidal or suicidal plan.        Cognition and Memory: Cognition and memory normal.        Judgment: Judgment normal.     Comments: Insight intact     Lab Review:     Component Value Date/Time   NA 140 05/09/2011 2137   K 3.5 05/09/2011 2137   CL 103 05/09/2011 2137   CO2 28 05/04/2011 0947   GLUCOSE 86 05/09/2011 2137   BUN 12 05/09/2011 2137   CREATININE 0.80 05/09/2011 2137   CALCIUM 10.2 05/04/2011 0947   GFRNONAA >90 05/04/2011 0947   GFRAA >90 05/04/2011 0947       Component Value Date/Time   WBC 12.3 (H) 05/04/2011 0947   RBC 4.59 05/04/2011 0947   HGB 12.9 05/09/2011 2137   HCT 38.0 05/09/2011 2137   PLT 332 05/04/2011 0947   MCV 88.7 05/04/2011 0947   MCH 28.8 05/04/2011 0947   MCHC 32.4 05/04/2011 0947   RDW 13.5 05/04/2011 0947    Lithium Lvl  Date Value Ref Range Status  07/11/2018 0.6 0.6 - 1.2 mmol/L Final    Comment:                                     Detection Limit =  0.1                           <0.1 indicates None Detected      No results found for: PHENYTOIN, PHENOBARB, VALPROATE, CBMZ   .res Assessment: Plan:    Sania was seen today for depression, anxiety, sleeping problem and stress.  Diagnoses and all orders for this visit:  Bipolar II disorder (HCC)  Insomnia due to mental condition -     LORazepam (ATIVAN) 0.5 MG tablet; TAKE 1 TO 2 TABLETS BY MOUTH TWICE A  DAY AS NEEDED FOR ANXIETY  Social anxiety disorder -     LORazepam (ATIVAN) 0.5 MG tablet; TAKE 1 TO 2 TABLETS BY MOUTH TWICE A DAY AS NEEDED FOR ANXIETY  Lithium use  Attention deficit hyperactivity disorder (ADHD), combined type  Adjustment disorder with anxious mood -     LORazepam (ATIVAN) 0.5 MG tablet; TAKE 1 TO 2 TABLETS BY MOUTH TWICE A DAY AS NEEDED FOR ANXIETY    Supportive therapy.  Dealing with new dx ALS.  Her genetic testing was negative for ALS.  Disc Dr. Arlie Solomons article on lithium  And it's neuroprotective effective even with ALS.  Extensive discussion.  Cont current meds.  But she is stopped fluoxetine, lamotrigine, and Vyvanse.  She also stopped quetiapine and is not taking propranolol.  Discussed the risk of stopping these multiple medications. Benefit from Trintellix 10 mg daily and tolerated.  Failed diazepam 10-20 mg Hs for severe insomnia.  Disc risk of insomnia having negative effect on her brain health. Discussed the alternative of clonazepam and her history of overdose on Xanax.  She is not currently suicidal.  The diazepam is more likely to work quicker with onset than his clonazepam but still should be helpful at keeping her sleep.  She questions whether other psych meds may have increased risks associated with ALS.  Wonders about the future.    Continue lithium.  Counseled patient regarding potential benefits, risks, and side effects of lithium to include potential risk of lithium affecting thyroid and renal function.  Discussed need for periodic lab monitoring to determine drug level and to assess for potential adverse effects.  Counseled patient regarding signs and symptoms of lithium toxicity and advised that they notify office immediately or seek urgent medical attention if experiencing these signs and symptoms.  Patient advised to contact office with any questions or concerns. She got lithium level and will fax it here. She goes to [facility] and needs written  order  Disc possible briefly stopping lithium to reevaluate effect on QTc.  There is mood risk off of lithium.  She is taking very little mood medicine at this point compared to what she is taken in the past.  Continue lithium at current dose.  Care re Caffeine, sleep hygiene, disc extensively including restriction and normal quantities.  Answered questions about supplements fro brain health and probiotic B longum, melatonin, and others. She plans to see a functional doctor at [facility] in hopes of reversing the ALS.  She's aware this is alternative medicine.    Supportive therapy to deal with grief over the dx of ALS and how to address.  Finding healthy  Balance on self research on ALS and also expressing interest in other areas of life.    40 min appt.    FU 3-4 mos  Meredith Staggers, MD, DFAPA  Please see After Visit Summary for patient specific instructions.  No future appointments.  No orders of the defined types were placed in this encounter.     -------------------------------

## 2019-06-06 ENCOUNTER — Telehealth: Payer: Self-pay | Admitting: Psychiatry

## 2019-06-06 NOTE — Telephone Encounter (Signed)
Pt stated she is having itching all over her body. She states she has discontinued all other supplements and by process of  elimination the Trintellix is the only culprit. Please advise.

## 2019-06-06 NOTE — Telephone Encounter (Signed)
Left detailed message with information, informed her I would call back in the morning to make sure she received my message.

## 2019-06-06 NOTE — Telephone Encounter (Signed)
It can take up to a few weeks for Trintellix to completely clear out of the system.  Stop the Trintellix abruptly.  She needs to start an antihistamine like Zyrtec 1 each night or Allegra 1 twice daily until the itching has resolved for several days and then she can try stopping the antihistamine.  We did not want to start a new medication until this problem clears up.  Call us back when the itching has resolved off the Trintellix.

## 2019-06-07 NOTE — Telephone Encounter (Signed)
Left another detailed message with information and to call back with further questions or concerns.

## 2019-06-20 NOTE — Telephone Encounter (Signed)
As soon as convenient, please call to check on her.

## 2019-06-22 NOTE — Telephone Encounter (Signed)
Left patient message to call back with an update

## 2019-06-26 ENCOUNTER — Telehealth: Payer: Self-pay | Admitting: Psychiatry

## 2019-06-26 NOTE — Telephone Encounter (Signed)
Cynthia Hogan called to report that she doesn't want to go back on the Trintellix due to the nausea that it causes her.  What are her other options?

## 2019-06-27 NOTE — Telephone Encounter (Signed)
Left message to call back to discuss.

## 2019-06-27 NOTE — Telephone Encounter (Signed)
Patient had done well on fluoxetine but stopped it out of concern about affecting QT intervals.  However studies show that fluoxetine is not at significant risk of raising QT interval.  The only SSRIs that have that risk are citalopram and S-Citalopram to a lesser extent.  So my suggestion would be to return to fluoxetine because she tolerated it.  If she agreeable to that plan.?

## 2019-06-28 NOTE — Telephone Encounter (Signed)
Left voicemail to call back and discuss options 

## 2019-07-04 NOTE — Telephone Encounter (Signed)
Left her Vm to return my call.

## 2019-07-04 NOTE — Telephone Encounter (Signed)
Please try to call her to again to give her the information in the previous note.

## 2019-07-07 NOTE — Telephone Encounter (Signed)
Left her another Vm to return my call.

## 2019-07-17 ENCOUNTER — Other Ambulatory Visit: Payer: Self-pay | Admitting: Psychiatry

## 2019-07-17 DIAGNOSIS — F4010 Social phobia, unspecified: Secondary | ICD-10-CM

## 2019-07-17 DIAGNOSIS — F4322 Adjustment disorder with anxiety: Secondary | ICD-10-CM

## 2019-07-17 DIAGNOSIS — F3181 Bipolar II disorder: Secondary | ICD-10-CM

## 2019-07-18 NOTE — Telephone Encounter (Signed)
Left her another vm today in regards to this message and her refill. I have not had any luck in reaching her for 2 weeks. If patient calls back I will relay the message to her.

## 2019-07-18 NOTE — Telephone Encounter (Signed)
Left her a VM to call me back.

## 2019-07-18 NOTE — Telephone Encounter (Signed)
Please check to see if she's still taking

## 2019-07-20 NOTE — Telephone Encounter (Signed)
Refill sent.

## 2019-08-28 ENCOUNTER — Other Ambulatory Visit: Payer: Self-pay | Admitting: Psychiatry

## 2019-08-28 NOTE — Telephone Encounter (Signed)
I do not see a current lithium level. Would you like me to call her?

## 2019-08-28 NOTE — Telephone Encounter (Signed)
Yes please contact her.  Grover Canavan had tried to reach her several times out of concern that Cynthia Hogan expressed about antidepressants.  Grover Canavan had been unable to reach her.  Cynthia Hogan has ALS and has been struggling with some depression.  Cynthia Hogan does not have an appointment.  Cynthia Hogan needs an appointment.   My last suggestion was that given her desire to change from Trintellix I suggested that Cynthia Hogan switch to fluoxetine but to my knowledge that has not happened.  Cynthia Hogan may not be comfortable with that suggestion for various reasons.  Please call to check on her and also we need a lithium level.

## 2019-08-29 NOTE — Telephone Encounter (Signed)
Left message to call back to discuss.

## 2019-08-31 NOTE — Telephone Encounter (Signed)
Left patient another message to call back to discuss medication

## 2019-09-04 ENCOUNTER — Other Ambulatory Visit: Payer: Self-pay | Admitting: Psychiatry

## 2019-09-04 DIAGNOSIS — F3181 Bipolar II disorder: Secondary | ICD-10-CM

## 2019-09-04 DIAGNOSIS — Z79899 Other long term (current) drug therapy: Secondary | ICD-10-CM

## 2019-09-04 NOTE — Progress Notes (Signed)
Lithium level and TSH sent to [facility]

## 2019-09-04 NOTE — Telephone Encounter (Signed)
Pt received a call on Friday 05/07/202. Returning call back to [place].

## 2019-09-04 NOTE — Telephone Encounter (Signed)
Finally was able to catch up with patient, she did switch back to Prozac and taking the dose she previously was on and says she's doing well. No side effects.  She asked if an order could be [place] [facility] [place] I told her if there was an issue we could fax it to them. She will go soon. She does need refill for lithium to [facility]

## 2019-11-05 ENCOUNTER — Other Ambulatory Visit: Payer: Self-pay | Admitting: Psychiatry

## 2019-11-06 NOTE — Telephone Encounter (Signed)
Sent message for her to get labs drawn

## 2019-11-09 ENCOUNTER — Telehealth: Payer: Self-pay | Admitting: Psychiatry

## 2019-11-09 NOTE — Telephone Encounter (Signed)
Pt husband [name] left message stating need new order for blood work and advise on how to get liquid or crushable lithium and other med. Return call @ [phone]

## 2019-11-10 ENCOUNTER — Telehealth: Payer: Self-pay | Admitting: Psychiatry

## 2019-11-10 ENCOUNTER — Other Ambulatory Visit: Payer: Self-pay

## 2019-11-10 ENCOUNTER — Other Ambulatory Visit: Payer: Self-pay | Admitting: Psychiatry

## 2019-11-10 MED ORDER — LITHIUM CARBONATE 150 MG PO CAPS: ORAL_CAPSULE | ORAL | 2 refills | Status: DC

## 2019-11-10 MED ORDER — FLUOXETINE HCL 20 MG PO CAPS: 20.0000 mg | ORAL_CAPSULE | Freq: Every day | ORAL | 0 refills | Status: DC

## 2019-11-10 NOTE — Telephone Encounter (Signed)
Sent update Rx for lithium carbonate 150 mg capsules, empty 3 capsules (450 mg) daily into soft food to [facility], [place]  Discontinued Trintellix and Lithium 450 mg tablets on medication profile. Added Fluoxetine 20 mg capsules 1 daily

## 2019-11-10 NOTE — Telephone Encounter (Signed)
LM to call back.

## 2019-11-10 NOTE — Telephone Encounter (Signed)
She's having trouble swallowing.  Trintellix can be crushed.  Ask the pharmacist if lithium citrate is available again.  IF NOT switch to lithium carbonate capsule 150 mg.  These can be taken apart to put the contents in soft food.

## 2019-11-10 NOTE — Telephone Encounter (Signed)
Fluoxetine is available as solution.

## 2019-11-10 NOTE — Telephone Encounter (Signed)
Unable to locate fluoxetine solution to send to pharmacy.  Please call it in.  Fluoxetine solution 20mg /5 ml.  Rx 5 ml daily. Quantity=1 bottle and 2 refills

## 2019-11-10 NOTE — Telephone Encounter (Signed)
Labs for Lithium level were faxed to [facility] on [address],[place] [phone], and mailed to the pt. 11/13/19

## 2019-11-10 NOTE — Progress Notes (Signed)
Unable to locate fluoxetine solution to send to pharmacy.

## 2019-11-15 NOTE — Telephone Encounter (Signed)
Left message that liquid fluoxetine is available and call back if they would like it called into their pharmacy instead of the capsules.

## 2019-11-30 LAB — TSH: TSH: 1.55 u[IU]/mL (ref 0.450–4.500)

## 2019-11-30 LAB — LITHIUM LEVEL: Lithium Lvl: 1.1 mmol/L (ref 0.5–1.2)

## 2019-12-25 ENCOUNTER — Telehealth: Payer: Self-pay | Admitting: Psychiatry

## 2019-12-25 NOTE — Telephone Encounter (Signed)
Pt has ALS.  Husband called and reported that she is seeming to have some panic attacks over the last three weeks. She does take Ativan at times and that helps. It does make her sleepy and tired.

## 2019-12-26 ENCOUNTER — Telehealth: Payer: Self-pay | Admitting: Psychiatry

## 2019-12-26 DIAGNOSIS — F5105 Insomnia due to other mental disorder: Secondary | ICD-10-CM

## 2019-12-26 DIAGNOSIS — F4010 Social phobia, unspecified: Secondary | ICD-10-CM

## 2019-12-26 DIAGNOSIS — F3181 Bipolar II disorder: Secondary | ICD-10-CM

## 2019-12-26 DIAGNOSIS — F4322 Adjustment disorder with anxiety: Secondary | ICD-10-CM

## 2019-12-26 MED ORDER — LORAZEPAM 0.5 MG PO TABS: ORAL_TABLET | ORAL | 2 refills | Status: DC

## 2019-12-26 NOTE — Telephone Encounter (Signed)
Cynthia Hogan called back. She can take the Ativan but it does make her weak and groggy and with her condition it is harder for her to speak and breath. Please call with what to do. She is not doing well.

## 2019-12-26 NOTE — Telephone Encounter (Signed)
Phone call made and noted

## 2019-12-26 NOTE — Telephone Encounter (Signed)
RTC  Panic attacks for 4 days ongoing.  Lorazepam 1 mg is necessary but helps 50% but makes her groggy.  Using vent at night. Mostly used 2 at night of 0.5 mg dose.  Rare in the day but out of it when takes it during day.  1/4 of the 0.5 mg tablet didn't help much.   She reports it makes her feel tired and affects her speech. Some issues with SOB predating the panic and day of panic felt like someone leaning on her chest.  O2 is typically in the low 90's.    Disc possibility of O2 during day or daytime vent to help the anxiety.  Is using the vent via BIPAP at night.  Going to [facility] tomorrow.  Spread the lorazepam out 0.25 - 0.5 mg TID and then 1-2 tablets at night to help prevent some of the anxiety and keep blood level more stable and improve tolerability.  They report that her lithium level November 29, 2019 of 1.1 was misleading because she forgot and took lithium before the test.  Therefore we will repeat a lithium level as a trough.  They agree.

## 2020-01-02 ENCOUNTER — Telehealth: Payer: Self-pay | Admitting: Psychiatry

## 2020-01-02 ENCOUNTER — Other Ambulatory Visit: Payer: Self-pay

## 2020-01-02 ENCOUNTER — Other Ambulatory Visit: Payer: Self-pay | Admitting: Psychiatry

## 2020-01-02 NOTE — Telephone Encounter (Signed)
Left message about calling in Fluoxetine suspension. Asked him to call back tomorrow for further questions on lithium.   Rx called into [facility].

## 2020-01-02 NOTE — Telephone Encounter (Signed)
What dose needs to be sent in for her liquid Prozac?

## 2020-01-02 NOTE — Telephone Encounter (Signed)
Pt husband [name] called requesting Fluoxetine 20 mg liquid form. Also, has questions on Lithium dose with new capsule form and mg. Just to clarify. Contact # [phone]

## 2020-01-02 NOTE — Telephone Encounter (Signed)
Please send or call in fluoxetine suspension 20 mg/5 ml, 5 ml daily, 90 day supply.  I did not see that option in Epic.  See my last phone note for instructions about lithium.  She has ALS and needs to avoid tablets and capsules.

## 2020-01-03 ENCOUNTER — Telehealth: Payer: Self-pay | Admitting: Psychiatry

## 2020-01-03 NOTE — Telephone Encounter (Signed)
Cynthia Hogan is notified and given information.

## 2020-01-03 NOTE — Telephone Encounter (Signed)
See previous phone messages. Everything completed. Husband aware.

## 2020-01-03 NOTE — Telephone Encounter (Signed)
Pt's spouse called about her Lithium dosage. Lawson Fiscal [phone]

## 2020-01-03 NOTE — Telephone Encounter (Signed)
Tried reaching him 2 times. VM is full. I will try again later.

## 2020-01-03 NOTE — Telephone Encounter (Signed)
Spoke with [name] and clarified Jisella's dose. Correct she should be taking 3 of the 150 mg capsules to equal 450 mg daily. He verbalized understanding.

## 2020-01-03 NOTE — Telephone Encounter (Signed)
Pt husband [name] called back to add to previous message about Lithium dose. His question is....  On the old Rx tablets 450 mg she took 2 1/2 daily =1125 mg. New Rx is 450 mg capsules 3/d =450 mg total. Is that accurate ?

## 2020-01-03 NOTE — Telephone Encounter (Signed)
Cynthia Hogan, can you call [name] for me. Tell him she is to take 3 of the 150 mg capsules. Open up and dissolve in food. Instant message me if he needs anything else. Let him know I called in her Fluoxetine Suspension.

## 2020-02-07 ENCOUNTER — Other Ambulatory Visit: Payer: Self-pay | Admitting: Psychiatry

## 2020-02-07 NOTE — Telephone Encounter (Signed)
Please review

## 2020-02-07 NOTE — Telephone Encounter (Signed)
Please call patient for appointment  °

## 2020-02-13 ENCOUNTER — Telehealth: Payer: Self-pay | Admitting: Psychiatry

## 2020-02-13 NOTE — Telephone Encounter (Signed)
LM for patient to call for follow up appointment to continue refills and to verify new address.

## 2020-02-13 NOTE — Telephone Encounter (Signed)
error 

## 2020-02-13 NOTE — Telephone Encounter (Signed)
Spoke to husband about making an appointment in December. She needs a refill on the fluoxetine prozac  liquid 20 mg ml solution. It needs to be sent to the [facility] [place].

## 2020-02-14 ENCOUNTER — Other Ambulatory Visit: Payer: Self-pay

## 2020-02-14 MED ORDER — FLUOXETINE HCL 20 MG/5ML PO SOLN: 20.0000 mg | Freq: Every day | ORAL | 3 refills | Status: DC

## 2020-02-14 NOTE — Progress Notes (Signed)
Husband called asking for refill on patient's Fluoxetine suspension 20 mg/5 ml take 5 ml daily, 90 day supply sent to [facility].  She is scheduled for an apt in December.

## 2020-02-14 NOTE — Telephone Encounter (Signed)
Rx sent to [facility] ([facility])

## 2020-03-04 ENCOUNTER — Other Ambulatory Visit: Payer: Self-pay | Admitting: Psychiatry

## 2020-03-23 ENCOUNTER — Other Ambulatory Visit: Payer: Self-pay | Admitting: Psychiatry

## 2020-04-11 ENCOUNTER — Telehealth (INDEPENDENT_AMBULATORY_CARE_PROVIDER_SITE_OTHER): Payer: 59 | Admitting: Psychiatry

## 2020-04-11 ENCOUNTER — Encounter: Payer: Self-pay | Admitting: Psychiatry

## 2020-04-11 DIAGNOSIS — F458 Other somatoform disorders: Secondary | ICD-10-CM

## 2020-04-11 DIAGNOSIS — F411 Generalized anxiety disorder: Secondary | ICD-10-CM

## 2020-04-11 DIAGNOSIS — F4010 Social phobia, unspecified: Secondary | ICD-10-CM

## 2020-04-11 DIAGNOSIS — F5105 Insomnia due to other mental disorder: Secondary | ICD-10-CM | POA: Diagnosis not present

## 2020-04-11 DIAGNOSIS — F3181 Bipolar II disorder: Secondary | ICD-10-CM | POA: Diagnosis not present

## 2020-04-11 DIAGNOSIS — F4322 Adjustment disorder with anxiety: Secondary | ICD-10-CM

## 2020-04-11 MED ORDER — LITHIUM CARBONATE 150 MG PO CAPS: ORAL_CAPSULE | ORAL | 1 refills | Status: AC

## 2020-04-11 MED ORDER — FLUOXETINE HCL 20 MG/5ML PO SOLN: ORAL | 3 refills | Status: AC

## 2020-04-11 MED ORDER — LORAZEPAM 0.5 MG PO TABS: ORAL_TABLET | ORAL | 2 refills | Status: AC

## 2020-04-11 MED ORDER — HYDROXYZINE HCL 10 MG/5ML PO SYRP: 10.0000 mg | ORAL_SOLUTION | Freq: Three times a day (TID) | ORAL | 0 refills | Status: AC | PRN

## 2020-04-11 NOTE — Progress Notes (Signed)
Cynthia Hogan [id number] Jan 09, 1966 54 y.o.  Virtual Visit via WebEX  I connected with pt by WebEx and verified that I am speaking with the correct person using two identifiers.   I discussed the limitations, risks, security and privacy concerns of performing an evaluation and management service by [facility] [place] and the availability of in person appointments. I also discussed with the patient that there may be a patient responsible charge related to this service. The patient expressed understanding and agreed to proceed.  I discussed the assessment and treatment plan with the patient. The patient was provided an opportunity to ask questions and all were answered. The patient agreed with the plan and demonstrated an understanding of the instructions.   The patient was advised to call back or seek an in-person evaluation if the symptoms worsen or if the condition fails to improve as anticipated.  I provided 30 minutes of video time during this encounter.  The patient was located at home and the provider was located office. Session started at 3 and ended at 330  Subjective:   Patient ID:  Cynthia Hogan is a 54 y.o. (DOB 04-10-1966) female.  Chief Complaint:  Chief Complaint  Patient presents with  . Follow-up  . Depression  . Anxiety    HPI [name] presents for follow-up of bipolar disorder with additional stressors of severe medical diagnosis.[name]  seen June 20, 2018.  No meds were changed.  Lithium level was ordered. March 6 was 0.6.  2020 DX ALS.  Followed at the [facility]. [name]  Been to [facility] and has done a lot of research.  Has a list of concerns re: meds and mental health.  Questions re: meds and supplements.    seen September 2020.  She had stopped virtually all of her psych meds.  We did not start any new psych meds. Tried Dayvigo or diazepam. Doing OK overall but a lot on her plate.  PT and OT  Sensitive to sleepers with fine line of dosing.  Both Dayvigo  and diazepam sensitive to it. Hard time sleeping ongoing.  Initial and terminal insomnia.  Also using orange glasses..  Will sleep sometimes  Later but only averaging 4 hours. Delayed sleep syndrome.  Adequate hours of sleep.   Some nights racing thought when can't go to sleep.  Tired.  Not usually a lot of anxiety about sleep but the last week she is dreading going to sleep. Taking melatonin.  No late caffeine.  1 diet coke in morning. Overwhelmed waiting for tentative drug trial.  Up in the air. Patient reports mood is better with Trintellix. Started Trintellix 04 February 2019.  Helped faster than I could have hoped.   Patient denies any recent difficulty with anxiety.    Denies appetite disturbance.  Patient reports that energy and motivation have been good.  Patient denies any difficulty with concentration.  Patient denies any suicidal ideation. Plan no med changes.  04/11/2020 appointment with the following noted: [name] on phone too. Shortly for the last appointment she developed itching all over her body which was felt to be related to Trintellix and it was discontinued. July fluoxetine 20 mg was added to the lithium 450 mg daily. August 2021 husband called indicating she was having panic attacks and was taking Ativan. Taking lorazepam regularly 1/4 of 0.5 mg BID and then 0.5 mg HS.  More than that makes speech more slurred.   Some days are harder than others with anxiety, bc  trouble breathing.  Can't get by without lorazepam.   Still itching and gabapentin added.  Itching turned out not to be related to Trintellix.   Jan to Sept lost 45 #.  Now feeding tube.  Better nutrition.  Weight stable since.  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 Stopped Prozac about August 2020 and noticed more tearfulness. Stopped lithium briefly and felt worse off of it.  Past Psychiatric Medication Trials:  , Wellbutrin,  citalopram, Zoloft, fluoxetine mid 2014, Trintellix,, Wellbutrin, Buspirone,  Seroquel 50 , doxepin,  Belsomra, Vistaril,  trazodone NR, Ativan no sleepiness, diazepam sedation Tried Dayvigo & diazepam. lithium, lamotrigine Geodon, Abilify, Cytomel Vyvanse, Concerta, Focalin,  NAC, Ritalin, Metadate,  Hx suicide attempt Xanax 100 mg Seroquel DT hangover.  Review of Systems:  Review of Systems  Constitutional: Positive for fatigue.  Gastrointestinal: Negative for nausea.  Musculoskeletal: Positive for gait problem.  Skin:       itching  Neurological: Positive for weakness. Negative for tremors.    Medications: I have reviewed the patient's current medications.  Current Outpatient Medications  Medication Sig Dispense Refill  . cetirizine (ZYRTEC) 10 MG tablet Take 10 mg by mouth daily.    [name]. FLUoxetine (PROZAC) 20 MG/5ML solution TAKE 5ML DAILY 120 mL 3  . gabapentin (NEURONTIN) 250 MG/5ML solution Take by mouth 3 (three) times daily. 450 mg twice daily and 600 mg at night for itching    . irbesartan (AVAPRO) 150 MG tablet     . lithium carbonate 150 MG capsule EMPTY CONTENTS OF 3 CAPSULES DAILY INTO SOFT FOOD 90 capsule 1  . LORazepam (ATIVAN) 0.5 MG tablet 1 tablet TID and 2 at night. 150 tablet 2  . Multiple Vitamins-Minerals (MULTIVITAMINS THER. W/MINERALS) TABS Take 2 tablets by mouth daily. Chewable.    [name]. MYRBETRIQ 25 MG TB24 tablet Take 50 mg by mouth daily.     [name]. Omeprazole-Sodium Bicarbonate (ZEGERID OTC PO) Take 1 tablet by mouth at bedtime.    . ondansetron (ZOFRAN) 4 MG tablet     . propranolol (INDERAL) 20 MG tablet TAKE 1 TABLET BY MOUTH  TWICE DAILY 180 tablet 3   No current facility-administered medications for this visit.    Medication Side Effects: None  Allergies:  Allergies  Allergen Reactions  . Other     Anesthesia med makes pt very ill. Pt not sure which medication it was.  . Tetracycline     Past Medical History:  Diagnosis Date  . Anxiety   .  Depression   . GERD (gastroesophageal reflux disease)   . Headache(784.0)    has daily, migranes in past  . Hypertension   . Mental disorder    Bipolar  . PONV (postoperative nausea and vomiting)   . Seasonal allergies   . Suicide (HCC)    attempt about 7 yrs ago  . Thyroid mass     History reviewed. No pertinent family history.  Social History   Socioeconomic History  . Marital status: Married    Spouse name: Not on file  . Number of children: Not on file  . Years of education: Not on file  . Highest education level: Not on file  Occupational History  . Not on file  Tobacco Use  . Smoking status: Never Smoker  . Smokeless tobacco: Never Used  Substance and Sexual Activity  . Alcohol use: Yes    Comment: occasionally  .  Drug use: Not on file  . Sexual activity: Yes    Birth control/protection: I.U.D.  Other Topics Concern  . Not on file  Social History Narrative  . Not on file   Social Determinants of Health   Financial Resource Strain: Not on file  Food Insecurity: Not on file  Transportation Needs: Not on file  Physical Activity: Not on file  Stress: Not on file  Social Connections: Not on file  Intimate Partner Violence: Not on file    Past Medical History, Surgical history, Social history, and Family history were reviewed and updated as appropriate.   Has been living in [place]. Please see review of systems for further details on the patient's review from today.   Objective:   Physical Exam:  There were no vitals taken for this visit.  Physical Exam Neurological:     Mental Status: She is alert and oriented to person, place, and time.     Cranial Nerves: No dysarthria.  Psychiatric:        Attention and Perception: Attention and perception normal.        Mood and Affect: Mood is anxious. Mood is not depressed. Affect is not tearful.        Speech: Speech is slurred.        Behavior: Behavior is slowed. Behavior is cooperative.         Thought Content: Thought content normal. Thought content is not paranoid or delusional. Thought content does not include homicidal or suicidal ideation. Thought content does not include homicidal or suicidal plan.        Cognition and Memory: Cognition and memory normal.        Judgment: Judgment normal.     Comments: Insight intact Voice considerably weaker     Lab Review:     Component Value Date/Time   NA 140 05/09/2011 2137   K 3.5 05/09/2011 2137   CL 103 05/09/2011 2137   CO2 28 05/04/2011 0947   GLUCOSE 86 05/09/2011 2137   BUN 12 05/09/2011 2137   CREATININE 0.80 05/09/2011 2137   CALCIUM 10.2 05/04/2011 0947   GFRNONAA >90 05/04/2011 0947   GFRAA >90 05/04/2011 0947       Component Value Date/Time   WBC 12.3 (H) 05/04/2011 0947   RBC 4.59 05/04/2011 0947   HGB 12.9 05/09/2011 2137   HCT 38.0 05/09/2011 2137   PLT 332 05/04/2011 0947   MCV 88.7 05/04/2011 0947   MCH 28.8 05/04/2011 0947   MCHC 32.4 05/04/2011 0947   RDW 13.5 05/04/2011 0947    Lithium Lvl  Date Value Ref Range Status  11/29/2019 1.1 0.5 - 1.2 mmol/L Final    Comment:    Plasma concentration of 0.5 - 0.8 mmol/L are advised for long-term use; concentrations of up to 1.2 mmol/L may be necessary during acute treatment.                                  Detection Limit = 0.1                           <0.1 indicates None Detected      No results found for: PHENYTOIN, PHENOBARB, VALPROATE, CBMZ   .res Assessment: Plan:    Adria was seen today for follow-up, depression and anxiety.  Diagnoses and all orders for this visit:  Bipolar II disorder (HCC)  Neuropathic pruritus  Insomnia due to mental condition  Social anxiety disorder  Adjustment disorder with anxious mood  Generalized anxiety disorder    Supportive therapy.  Dealing with new dx ALS.  Her genetic testing was negative for ALS.  Disc Dr. Arlie Solomons article on lithium  And it's neuroprotective effective even with ALS.  Extensive  discussion.  Cont current meds.  But she is stopped fluoxetine, lamotrigine, and Vyvanse.  She also stopped quetiapine and is not taking propranolol.  Discussed the risk of stopping these multiple medications. Increase fluoxetine to 7 ml daily from 5 ml in hopes of better anxiety benefit.  Trial hydroxyzine for itching.  Sleep ok with lorazepam.  She questions whether other psych meds may have increased risks associated with ALS.  Wonders about the future.    Continue lithium.  Counseled patient regarding potential benefits, risks, and side effects of lithium to include potential risk of lithium affecting thyroid and renal function.  Discussed need for periodic lab monitoring to determine drug level and to assess for potential adverse effects.  Counseled patient regarding signs and symptoms of lithium toxicity and advised that they notify office immediately or seek urgent medical attention if experiencing these signs and symptoms.  Patient advised to contact office with any questions or concerns.  Check lithium level.  Continue lithium at current dose at 450 mg daily.   Care re Caffeine, sleep hygiene, disc extensively including restriction and normal quantities.  Supportive therapy to deal with grief over the dx of ALS and how to address.  Finding healthy  Balance on self research on ALS and also expressing interest in other areas of life.    40 min appt.    FU 3-4 mos  Meredith Staggers, MD, DFAPA  Please see After Visit Summary for patient specific instructions.  No future appointments.  No orders of the defined types were placed in this encounter.     -------------------------------

## 2020-05-28 DEATH — deceased
